# Patient Record
Sex: Female | Born: 1935
Health system: Southern US, Community
[De-identification: ages and names within clinical notes are randomized; demographics above are authoritative.]

## PROBLEM LIST (undated history)

## (undated) DIAGNOSIS — E7800 Pure hypercholesterolemia, unspecified: Secondary | ICD-10-CM

## (undated) DIAGNOSIS — I729 Aneurysm of unspecified site: Secondary | ICD-10-CM

## (undated) HISTORY — PX: CEREBRAL ANEURYSM REPAIR: SHX164

---

## 2013-03-02 ENCOUNTER — Emergency Department (HOSPITAL_COMMUNITY): Payer: Medicare HMO

## 2013-03-02 ENCOUNTER — Encounter (HOSPITAL_COMMUNITY): Payer: Self-pay | Admitting: Emergency Medicine

## 2013-03-02 ENCOUNTER — Emergency Department (HOSPITAL_COMMUNITY)
Admission: EM | Admit: 2013-03-02 | Discharge: 2013-03-02 | Disposition: A | Discharge status: Home / Self Care | Payer: Medicare HMO | Attending: Emergency Medicine | Admitting: Emergency Medicine

## 2013-03-02 DIAGNOSIS — S0003XA Contusion of scalp, initial encounter: Secondary | ICD-10-CM | POA: Insufficient documentation

## 2013-03-02 DIAGNOSIS — S60229A Contusion of unspecified hand, initial encounter: Secondary | ICD-10-CM | POA: Insufficient documentation

## 2013-03-02 DIAGNOSIS — Z79899 Other long term (current) drug therapy: Secondary | ICD-10-CM | POA: Insufficient documentation

## 2013-03-02 DIAGNOSIS — Y9301 Activity, walking, marching and hiking: Secondary | ICD-10-CM | POA: Insufficient documentation

## 2013-03-02 DIAGNOSIS — S5010XA Contusion of unspecified forearm, initial encounter: Secondary | ICD-10-CM | POA: Insufficient documentation

## 2013-03-02 DIAGNOSIS — S0083XA Contusion of other part of head, initial encounter: Secondary | ICD-10-CM

## 2013-03-02 DIAGNOSIS — T07XXXA Unspecified multiple injuries, initial encounter: Secondary | ICD-10-CM

## 2013-03-02 DIAGNOSIS — W19XXXA Unspecified fall, initial encounter: Secondary | ICD-10-CM

## 2013-03-02 DIAGNOSIS — Y92009 Unspecified place in unspecified non-institutional (private) residence as the place of occurrence of the external cause: Secondary | ICD-10-CM | POA: Insufficient documentation

## 2013-03-02 DIAGNOSIS — Z23 Encounter for immunization: Secondary | ICD-10-CM | POA: Insufficient documentation

## 2013-03-02 DIAGNOSIS — E7800 Pure hypercholesterolemia, unspecified: Secondary | ICD-10-CM | POA: Insufficient documentation

## 2013-03-02 DIAGNOSIS — S5000XA Contusion of unspecified elbow, initial encounter: Secondary | ICD-10-CM | POA: Insufficient documentation

## 2013-03-02 DIAGNOSIS — IMO0002 Reserved for concepts with insufficient information to code with codable children: Secondary | ICD-10-CM | POA: Insufficient documentation

## 2013-03-02 DIAGNOSIS — W010XXA Fall on same level from slipping, tripping and stumbling without subsequent striking against object, initial encounter: Secondary | ICD-10-CM | POA: Insufficient documentation

## 2013-03-02 HISTORY — DX: Pure hypercholesterolemia, unspecified: E78.00

## 2013-03-02 MED ORDER — BACITRACIN-NEOMYCIN-POLYMYXIN 400-5-5000 EX OINT
TOPICAL_OINTMENT | Freq: Once | CUTANEOUS | Status: AC
  Administered 2013-03-02: 4 via TOPICAL
  Filled 2013-03-02: qty 4

## 2013-03-02 MED ORDER — PHENYLEPHRINE HCL 0.5 % NA SOLN
1.0000 [drp] | Freq: Once | NASAL | Status: AC
  Administered 2013-03-02: 1 [drp] via NASAL
  Filled 2013-03-02: qty 15

## 2013-03-02 MED ORDER — HYDROCODONE-ACETAMINOPHEN 5-325 MG PO TABS: ORAL_TABLET | ORAL | Status: DC

## 2013-03-02 MED ORDER — TETANUS-DIPHTH-ACELL PERTUSSIS 5-2.5-18.5 LF-MCG/0.5 IM SUSP
0.5000 mL | Freq: Once | INTRAMUSCULAR | Status: AC
  Administered 2013-03-02: 0.5 mL via INTRAMUSCULAR
  Filled 2013-03-02: qty 0.5

## 2013-03-02 NOTE — ED Provider Notes (Signed)
CSN: [id number]     Arrival date & time 03/02/13  1534 History   First MD Initiated Contact with Patient 03/02/13 1605     Chief Complaint  Patient presents with  . Fall    HPI Pt was seen at 1605. Per pt and family, c/o sudden onset and resolution of one episode of slip and fall that occurred PTA. Pt states she was walking the dog down her driveway and "he just took off." States she tried to hold on to his leash and wound up falling down forward onto her bilat forearms, chest and face, sustaining multiple abrasions and bruises. Believes she had a nosebleed, now stopped. Pt was able to stand up and walk on her own after the incident.  Denies LOC, no syncope, no AMS, no neck or back pain, no palpitations, no SOB/cough, no abd pain, no N/V/D, no visual changes, no focal motor weakness, no tingling/numbness in extremities.    Unknown last Td Past Medical History  Diagnosis Date  . Hypercholesteremia    Past Surgical History  Procedure Laterality Date  . Cerebral aneurysm repair      History  Substance Use Topics  . Smoking status: Never Smoker   . Smokeless tobacco: Not on file  . Alcohol Use: No    Review of Systems ROS: Statement: All systems negative except as marked or noted in the HPI; Constitutional: Negative for fever and chills. ; ; Eyes: Negative for eye pain, redness and discharge. ; ; ENMT: +nosebleed. Negative for ear pain, hoarseness, nasal congestion, sinus pressure and sore throat. ; ; Cardiovascular: Negative for palpitations, diaphoresis, dyspnea and peripheral edema. ; ; Respiratory: Negative for cough, wheezing and stridor. ; ; Gastrointestinal: Negative for nausea, vomiting, diarrhea, abdominal pain, blood in stool, hematemesis, jaundice and rectal bleeding. . ; ; Genitourinary: Negative for dysuria, flank pain and hematuria. ; ; Musculoskeletal: +chest wall/ribs pain, face pain, forearms pain. Negative for back pain and neck pain. Negative for swelling and deformity..;  ; Skin: +abrasions, bruising. Negative for pruritus, rash,  blisters, and skin lesion.; ; Neuro: Negative for headache, lightheadedness and neck stiffness. Negative for weakness, altered level of consciousness , altered mental status, extremity weakness, paresthesias, involuntary movement, seizure and syncope.       Allergies  Niacin and related  Home Medications   Current Outpatient Rx  Name  Route  Sig  Dispense  Refill  . Ascorbic Acid (VITAMIN C PO)   Oral   Take 1 tablet by mouth daily.         Marland Kitchen aspirin EC 81 MG tablet   Oral   Take 81 mg by mouth at bedtime.         Marland Kitchen BIOTIN PO   Oral   Take 1 tablet by mouth daily.         Marland Kitchen CALCIUM PO   Oral   Take 1 tablet by mouth daily.         . Cholecalciferol (VITAMIN D PO)   Oral   Take 1 tablet by mouth daily.         . Coenzyme Q10 (CO Q 10 PO)   Oral   Take 1 tablet by mouth daily.         . fish oil-omega-3 fatty acids 1000 MG capsule      1 g 2 (two) times daily.         Marland Kitchen glucosamine-chondroitin 500-400 MG tablet   Oral   Take 1 tablet by mouth  daily.         . Nutritional Supplements (JOINT FORMULA PO)   Oral   Take 30 mLs by mouth daily.         . simvastatin (ZOCOR) 20 MG tablet   Oral   Take 20 mg by mouth at bedtime.          Marland Kitchen VITAMIN E PO   Oral   Take 1 tablet by mouth daily.          BP 139/54  Pulse 84  Temp(Src) 98 F (36.7 C) (Oral)  Ht 5\' 7"  (1.702 m)  Wt 199 lb (90.266 kg)  BMI 31.16 kg/m2  SpO2 95% Physical Exam 1610: Physical examination: Vital signs and O2 SAT: Reviewed; Constitutional: Well developed, Well nourished, Well hydrated, In no acute distress; Head and Face: Normocephalic, +left forehead contusion and small superficial abrasions to forehead and proximal nasal bridge. No lacs.  Non-tender to palp superior and inferior orbital rim areas.  No zygoma tenderness.  No mandibular tenderness.; Eyes: EOMI, PERRL, No scleral icterus. No conjunctival  injection. No obvious hyphema or hypopyon bilat.; ENMT: Mouth and pharynx normal, Left TM normal, Right TM normal, Mucous membranes moist, +teeth and tongue intact.  No active intraoral or intranasal bleeding. +dried blood right and left anterior nares. No blood in posterior pharynx. No septal hematomas.  No trismus, no malocclusion.; Neck: Supple, Trachea midline; Spine: No midline CS, TS, LS tenderness.; Cardiovascular: Regular rate and rhythm, No gallop; Respiratory: Breath sounds clear & equal bilaterally, No wheezes, Normal respiratory effort/excursion; Chest: Nontender, No deformity, Movement normal, No crepitus, No abrasions or ecchymosis.; Abdomen: Soft, Nontender, Nondistended, Normal bowel sounds, No abrasions or ecchymosis.; Genitourinary: No CVA tenderness;; Extremities: No deformity, Full range of motion major/large joints of bilat UE's and LE's without pain or tenderness to palp, Neurovascularly intact, Pulses normal, No tenderness, No edema, Pelvis stable. NT bilateral shoulders/elbows/wrists/hands/fingers. No bilat snuffbox tenderness.  No pain to bilat axial thumb or 3rd MCP loading.  Bilat forearm compartments soft, strong radial pp, brisk cap refill in fingers. Bilat hands NMS intact.; Neuro: AA&Ox3, GCS 15.  Major CN grossly intact. Speech clear. No gross focal motor or sensory deficits in extremities.; Skin: Color normal, Warm, Dry; +multiple scattered superficial abrasions and contusions to bilat hands, forearms, and elbows.     ED Course  Procedures   1615:  Wounds cleaned. Td updated. Neo-synephrine to nares. Right nasal mucosa friable; pt continues without active epistaxis bilat. Will continue to monitor.  1830:  No epistaxis while in the ED. VSS, resps easy, abd remains benign. Pt currently denies any pain and wants to go home now. Dx and testing d/w pt and family.  Questions answered.  Verb understanding, agreeable to d/c home with outpt f/u.    MDM  MDM Reviewed: nursing  note and vitals Interpretation: x-ray and CT scan      Dg Chest 2 View 03/02/2013   CLINICAL DATA:  Fall today. Bruising bilateral elbows, wrists and hands. Chest trauma.  EXAM: CHEST  2 VIEW  COMPARISON:  None.  FINDINGS: Cardiopericardial silhouette appears within normal limits. No pneumothorax is identified. Bilateral pleural apical scarring. Thoracic spine vertebral body height appears within normal limits. No pleural fluid. Mediastinal contours are within normal limits.  IMPRESSION: No active cardiopulmonary disease.   Electronically Signed   By: Andreas Newport M.D.   On: 03/02/2013 17:20   Dg Elbow Complete Left 03/02/2013   CLINICAL DATA:  Fall, elbow pain  EXAM: LEFT  ELBOW - COMPLETE 3+ VIEW  COMPARISON:  Concurrently obtained radiographs of the contralateral right elbow  FINDINGS: There is no evidence of fracture, dislocation, or joint effusion. There is no evidence of arthropathy or other focal bone abnormality. Soft tissues are unremarkable  IMPRESSION: Negative.   Electronically Signed   By: Malachy Moan M.D.   On: 03/02/2013 17:25   Dg Elbow Complete Right 03/02/2013   CLINICAL DATA:  Fall, elbow pain  EXAM: RIGHT ELBOW - COMPLETE 3+ VIEW  COMPARISON:  Concurrently obtained radiographs of the contralateral left elbow  FINDINGS: There is no evidence of fracture, dislocation, or joint effusion. Ossification adjacent to the medial epicondyles suggest prior medial epicondylitis. Soft tissues are unremarkable  IMPRESSION: No acute fracture or malalignment.   Electronically Signed   By: Malachy Moan M.D.   On: 03/02/2013 17:26   Dg Wrist Complete Left 03/02/2013   CLINICAL DATA:  Fall, bruising and pain  EXAM: LEFT WRIST - COMPLETE 3+ VIEW  COMPARISON:  Concurrently obtained radiographs of the bilateral wrists and hands  FINDINGS: No acute fracture or malalignment. Mild degenerative change at the thumb [facility] joint. Normal bony mineralization. No focal soft tissue abnormality.  IMPRESSION:  No acute osseous abnormality.   Electronically Signed   By: Malachy Moan M.D.   On: 03/02/2013 17:30   Dg Wrist Complete Right 03/02/2013   CLINICAL DATA:  Fall, wrist pain  EXAM: RIGHT WRIST - COMPLETE 3+ VIEW  COMPARISON:  Concurrently obtained radiographs of the bilateral wrists and hands  FINDINGS: No acute fracture or malalignment. Degenerative osteoarthritis noted in the thumb carpometacarpal joint. Bony mineralization is within normal limits for age. Mild soft tissue swelling over the dorsal aspect of the distal forearm.  IMPRESSION: No acute fracture or malalignment.  Degenerative osteoarthritis in the thumb carpometacarpal joint.   Electronically Signed   By: Malachy Moan M.D.   On: 03/02/2013 17:28   Ct Head Wo Contrast 03/02/2013   CLINICAL DATA:  Large left supraorbital hematoma following a fall today. Laceration at the bridge of the nose. Bruising beneath the left eye. History of aneurysm behind the right eye age, repaired in 1999. Headache and neck stiffness.  EXAM: CT HEAD WITHOUT CONTRAST  CT MAXILLOFACIAL WITHOUT CONTRAST  CT CERVICAL SPINE WITHOUT CONTRAST  TECHNIQUE: Multidetector CT imaging of the head, cervical spine, and maxillofacial structures were performed using the standard protocol without intravenous contrast. Multiplanar CT image reconstructions of the cervical spine and maxillofacial structures were also generated.  COMPARISON:  None.  FINDINGS: CT HEAD FINDINGS  Post craniotomy changes on the right. Right-sided suprasellar aneurysm clips. Left frontal scalp hematoma and soft tissue air. Bilateral hyperostosis frontalis. No skull fracture, intracranial hemorrhage or mass lesion. No paranasal sinus air-fluid levels. Normal size and position of the ventricles.  CT MAXILLOFACIAL FINDINGS  Left frontal scalp hematoma. Small amount of soft tissue air anterior to the nasal bone on the left. The nasal bone and anterior maxillary spine are intact without fracture. The remainder  of the facial bones are also intact. No paranasal sinus air-fluid levels. Left inferior maxillary sinus retention cyst and minimal mucosal thickening, within normal limits of physiological thickening.  CT CERVICAL SPINE FINDINGS  Multilevel degenerative changes. No prevertebral soft tissue swelling or fractures. Minimal anterolisthesis at the C3-4 level and at the C4-5 level with facet degenerative changes at those levels. Minimal carotid artery calcification on the right. Mildly heterogeneous thyroid gland with a 7 mm low-density nodule on the right on image  number 75. The thyroid gland is normal in size. Biapical pleural and parenchymal scarring.  IMPRESSION: CT HEAD IMPRESSION  1. Left frontal scalp hematoma and soft tissue air without skull fracture or intracranial hemorrhage. 2. Postsurgical changes, as described above.  CT MAXILLOFACIAL IMPRESSION  Mild left nasal soft tissue air without fracture.  CT CERVICAL SPINE IMPRESSION  1. No fracture or traumatic subluxation. 2. Multilevel degenerative changes. 3. Mildly heterogeneous thyroid gland with a 7 mm discrete nodule on the right. This is too small to characterize, but most likely benign in the absence of known clinical risk factors for thyroid carcinoma.   Electronically Signed   By: Gordan Payment M.D.   On: 03/02/2013 17:57   Ct Cervical Spine Wo Contrast 03/02/2013   CLINICAL DATA:  Large left supraorbital hematoma following a fall today. Laceration at the bridge of the nose. Bruising beneath the left eye. History of aneurysm behind the right eye age, repaired in 1999. Headache and neck stiffness.  EXAM: CT HEAD WITHOUT CONTRAST  CT MAXILLOFACIAL WITHOUT CONTRAST  CT CERVICAL SPINE WITHOUT CONTRAST  TECHNIQUE: Multidetector CT imaging of the head, cervical spine, and maxillofacial structures were performed using the standard protocol without intravenous contrast. Multiplanar CT image reconstructions of the cervical spine and maxillofacial structures were  also generated.  COMPARISON:  None.  FINDINGS: CT HEAD FINDINGS  Post craniotomy changes on the right. Right-sided suprasellar aneurysm clips. Left frontal scalp hematoma and soft tissue air. Bilateral hyperostosis frontalis. No skull fracture, intracranial hemorrhage or mass lesion. No paranasal sinus air-fluid levels. Normal size and position of the ventricles.  CT MAXILLOFACIAL FINDINGS  Left frontal scalp hematoma. Small amount of soft tissue air anterior to the nasal bone on the left. The nasal bone and anterior maxillary spine are intact without fracture. The remainder of the facial bones are also intact. No paranasal sinus air-fluid levels. Left inferior maxillary sinus retention cyst and minimal mucosal thickening, within normal limits of physiological thickening.  CT CERVICAL SPINE FINDINGS  Multilevel degenerative changes. No prevertebral soft tissue swelling or fractures. Minimal anterolisthesis at the C3-4 level and at the C4-5 level with facet degenerative changes at those levels. Minimal carotid artery calcification on the right. Mildly heterogeneous thyroid gland with a 7 mm low-density nodule on the right on image number 75. The thyroid gland is normal in size. Biapical pleural and parenchymal scarring.  IMPRESSION: CT HEAD IMPRESSION  1. Left frontal scalp hematoma and soft tissue air without skull fracture or intracranial hemorrhage. 2. Postsurgical changes, as described above.  CT MAXILLOFACIAL IMPRESSION  Mild left nasal soft tissue air without fracture.  CT CERVICAL SPINE IMPRESSION  1. No fracture or traumatic subluxation. 2. Multilevel degenerative changes. 3. Mildly heterogeneous thyroid gland with a 7 mm discrete nodule on the right. This is too small to characterize, but most likely benign in the absence of known clinical risk factors for thyroid carcinoma.   Electronically Signed   By: Gordan Payment M.D.   On: 03/02/2013 17:57   Dg Hand Complete Left 03/02/2013   CLINICAL DATA:  Fall, hand  pain  EXAM: LEFT HAND - COMPLETE 3+ VIEW  COMPARISON:  Concurrently obtained radiographs of the wrists  FINDINGS: There is no evidence of fracture or dislocation. Degenerative osteoarthritis of the distal interphalangeal joints with mild ulnar deviation at the DIP joints of the long finger. This bony mineralization is within normal limits for age. Soft tissues are unremarkable.  IMPRESSION: 1. No acute fracture or malalignment 2. Degenerative  osteoarthritis in the distal interphalangeal joints.   Electronically Signed   By: Malachy Moan M.D.   On: 03/02/2013 17:24   Dg Hand Complete Right 03/02/2013   CLINICAL DATA:  Fall, hand pain  EXAM: RIGHT HAND - COMPLETE 3+ VIEW  COMPARISON:  Concurrently obtained radiographs of the bilateral hands and wrists  FINDINGS: No acute fracture or malalignment. Degenerative osteoarthritis in the thumb carpometacarpal joint and index and little finger DIP joints. Bony mineralization is within normal limits  IMPRESSION: No acute fracture or malalignment.   Electronically Signed   By: Malachy Moan M.D.   On: 03/02/2013 17:29   Ct Maxillofacial Wo Cm  03/02/2013   CLINICAL DATA:  Large left supraorbital hematoma following a fall today. Laceration at the bridge of the nose. Bruising beneath the left eye. History of aneurysm behind the right eye age, repaired in 1999. Headache and neck stiffness.  EXAM: CT HEAD WITHOUT CONTRAST  CT MAXILLOFACIAL WITHOUT CONTRAST  CT CERVICAL SPINE WITHOUT CONTRAST  TECHNIQUE: Multidetector CT imaging of the head, cervical spine, and maxillofacial structures were performed using the standard protocol without intravenous contrast. Multiplanar CT image reconstructions of the cervical spine and maxillofacial structures were also generated.  COMPARISON:  None.  FINDINGS: CT HEAD FINDINGS  Post craniotomy changes on the right. Right-sided suprasellar aneurysm clips. Left frontal scalp hematoma and soft tissue air. Bilateral hyperostosis frontalis.  No skull fracture, intracranial hemorrhage or mass lesion. No paranasal sinus air-fluid levels. Normal size and position of the ventricles.  CT MAXILLOFACIAL FINDINGS  Left frontal scalp hematoma. Small amount of soft tissue air anterior to the nasal bone on the left. The nasal bone and anterior maxillary spine are intact without fracture. The remainder of the facial bones are also intact. No paranasal sinus air-fluid levels. Left inferior maxillary sinus retention cyst and minimal mucosal thickening, within normal limits of physiological thickening.  CT CERVICAL SPINE FINDINGS  Multilevel degenerative changes. No prevertebral soft tissue swelling or fractures. Minimal anterolisthesis at the C3-4 level and at the C4-5 level with facet degenerative changes at those levels. Minimal carotid artery calcification on the right. Mildly heterogeneous thyroid gland with a 7 mm low-density nodule on the right on image number 75. The thyroid gland is normal in size. Biapical pleural and parenchymal scarring.  IMPRESSION: CT HEAD IMPRESSION  1. Left frontal scalp hematoma and soft tissue air without skull fracture or intracranial hemorrhage. 2. Postsurgical changes, as described above.  CT MAXILLOFACIAL IMPRESSION  Mild left nasal soft tissue air without fracture.  CT CERVICAL SPINE IMPRESSION  1. No fracture or traumatic subluxation. 2. Multilevel degenerative changes. 3. Mildly heterogeneous thyroid gland with a 7 mm discrete nodule on the right. This is too small to characterize, but most likely benign in the absence of known clinical risk factors for thyroid carcinoma.   Electronically Signed   By: Gordan Payment M.D.   On: 03/02/2013 17:57       Laray Anger, DO 03/05/13 1109

## 2013-03-02 NOTE — ED Notes (Signed)
Fell on driveway , pulled  Down by dog.  No LOC,  Facial contusions, lt periorbital swelling and abrasion,  Abrasions and contusions to rt elbow and forearm.   Chest hurts.

## 2013-03-02 NOTE — ED Notes (Signed)
EDP back in to re eval 

## 2015-07-21 ENCOUNTER — Encounter (HOSPITAL_COMMUNITY): Payer: Self-pay | Admitting: *Deleted

## 2015-07-21 ENCOUNTER — Emergency Department (HOSPITAL_COMMUNITY): Payer: Medicare FFS

## 2015-07-21 ENCOUNTER — Observation Stay (HOSPITAL_COMMUNITY)
Admission: EM | Admit: 2015-07-21 | Discharge: 2015-07-22 | Disposition: A | Discharge status: Home / Self Care | Payer: Medicare FFS | Attending: Internal Medicine | Admitting: Internal Medicine

## 2015-07-21 DIAGNOSIS — I729 Aneurysm of unspecified site: Secondary | ICD-10-CM | POA: Diagnosis not present

## 2015-07-21 DIAGNOSIS — E7800 Pure hypercholesterolemia, unspecified: Secondary | ICD-10-CM | POA: Insufficient documentation

## 2015-07-21 DIAGNOSIS — G629 Polyneuropathy, unspecified: Secondary | ICD-10-CM | POA: Diagnosis not present

## 2015-07-21 DIAGNOSIS — R202 Paresthesia of skin: Principal | ICD-10-CM | POA: Insufficient documentation

## 2015-07-21 DIAGNOSIS — R200 Anesthesia of skin: Secondary | ICD-10-CM | POA: Diagnosis present

## 2015-07-21 DIAGNOSIS — Z7982 Long term (current) use of aspirin: Secondary | ICD-10-CM | POA: Diagnosis not present

## 2015-07-21 DIAGNOSIS — Z8673 Personal history of transient ischemic attack (TIA), and cerebral infarction without residual deficits: Secondary | ICD-10-CM | POA: Diagnosis present

## 2015-07-21 DIAGNOSIS — I671 Cerebral aneurysm, nonruptured: Secondary | ICD-10-CM | POA: Diagnosis present

## 2015-07-21 DIAGNOSIS — Z79899 Other long term (current) drug therapy: Secondary | ICD-10-CM | POA: Diagnosis not present

## 2015-07-21 DIAGNOSIS — G459 Transient cerebral ischemic attack, unspecified: Secondary | ICD-10-CM | POA: Diagnosis present

## 2015-07-21 HISTORY — DX: Aneurysm of unspecified site: I72.9

## 2015-07-21 LAB — DIFFERENTIAL
BASOS ABS: 0 10*3/uL (ref 0.0–0.1)
Basophils Relative: 1 %
EOS ABS: 0.1 10*3/uL (ref 0.0–0.7)
Eosinophils Relative: 2 %
LYMPHS ABS: 1.7 10*3/uL (ref 0.7–4.0)
Lymphocytes Relative: 25 %
MONO ABS: 0.6 10*3/uL (ref 0.1–1.0)
Monocytes Relative: 9 %
NEUTROS PCT: 63 %
Neutro Abs: 4.4 10*3/uL (ref 1.7–7.7)

## 2015-07-21 LAB — RAPID URINE DRUG SCREEN, HOSP PERFORMED
Amphetamines: NOT DETECTED
BARBITURATES: NOT DETECTED
Benzodiazepines: NOT DETECTED
Cocaine: NOT DETECTED
Opiates: NOT DETECTED
Tetrahydrocannabinol: NOT DETECTED

## 2015-07-21 LAB — PROTIME-INR
INR: 1.03 (ref 0.00–1.49)
PROTHROMBIN TIME: 13.7 s (ref 11.6–15.2)

## 2015-07-21 LAB — URINALYSIS, ROUTINE W REFLEX MICROSCOPIC
Bilirubin Urine: NEGATIVE
Glucose, UA: NEGATIVE mg/dL
Hgb urine dipstick: NEGATIVE
Ketones, ur: NEGATIVE mg/dL
LEUKOCYTES UA: NEGATIVE
Nitrite: NEGATIVE
PROTEIN: NEGATIVE mg/dL
Specific Gravity, Urine: <1.005 — ABNORMAL LOW (ref 1.005–1.030)
pH: 6 (ref 5.0–8.0)

## 2015-07-21 LAB — COMPREHENSIVE METABOLIC PANEL
ALT: 21 U/L (ref 14–54)
AST: 23 U/L (ref 15–41)
Albumin: 4.6 g/dL (ref 3.5–5.0)
Alkaline Phosphatase: 44 U/L (ref 38–126)
Anion gap: 8 (ref 5–15)
BILIRUBIN TOTAL: 0.5 mg/dL (ref 0.3–1.2)
BUN: 13 mg/dL (ref 6–20)
CO2: 29 mmol/L (ref 22–32)
CREATININE: 0.96 mg/dL (ref 0.44–1.00)
Calcium: 10.1 mg/dL (ref 8.9–10.3)
Chloride: 107 mmol/L (ref 101–111)
GFR, EST NON AFRICAN AMERICAN: 56 mL/min — AB (ref >60)
Glucose, Bld: 104 mg/dL — ABNORMAL HIGH (ref 65–99)
POTASSIUM: 4.7 mmol/L (ref 3.5–5.1)
Sodium: 144 mmol/L (ref 135–145)
TOTAL PROTEIN: 7.5 g/dL (ref 6.5–8.1)

## 2015-07-21 LAB — CBC
HCT: 41.7 % (ref 36.0–46.0)
Hemoglobin: 13.9 g/dL (ref 12.0–15.0)
MCH: 30.5 pg (ref 26.0–34.0)
MCHC: 33.3 g/dL (ref 30.0–36.0)
MCV: 91.6 fL (ref 78.0–100.0)
PLATELETS: 196 10*3/uL (ref 150–400)
RBC: 4.55 MIL/uL (ref 3.87–5.11)
RDW: 12.3 % (ref 11.5–15.5)
WBC: 6.8 10*3/uL (ref 4.0–10.5)

## 2015-07-21 LAB — I-STAT CHEM 8, ED
BUN: 12 mg/dL (ref 6–20)
CREATININE: 0.9 mg/dL (ref 0.44–1.00)
Calcium, Ion: 1.29 mmol/L (ref 1.13–1.30)
Chloride: 103 mmol/L (ref 101–111)
Glucose, Bld: 98 mg/dL (ref 65–99)
HEMATOCRIT: 44 % (ref 36.0–46.0)
HEMOGLOBIN: 15 g/dL (ref 12.0–15.0)
POTASSIUM: 4.6 mmol/L (ref 3.5–5.1)
SODIUM: 144 mmol/L (ref 135–145)
TCO2: 28 mmol/L (ref 0–100)

## 2015-07-21 LAB — CBG MONITORING, ED: GLUCOSE-CAPILLARY: 98 mg/dL (ref 65–99)

## 2015-07-21 LAB — APTT: APTT: 28 s (ref 24–37)

## 2015-07-21 LAB — I-STAT TROPONIN, ED: Troponin i, poc: 0 ng/mL (ref 0.00–0.08)

## 2015-07-21 LAB — ETHANOL: Alcohol, Ethyl (B): 5 mg/dL — ABNORMAL HIGH (ref <5)

## 2015-07-21 MED ORDER — OMEGA-3 FATTY ACIDS 1000 MG PO CAPS: 1.0000 g | ORAL_CAPSULE | Freq: Every day | ORAL | Status: DC

## 2015-07-21 MED ORDER — ENOXAPARIN SODIUM 40 MG/0.4ML ~~LOC~~ SOLN
40.0000 mg | SUBCUTANEOUS | Status: DC
  Administered 2015-07-21: 40 mg via SUBCUTANEOUS
  Filled 2015-07-21: qty 0.4

## 2015-07-21 MED ORDER — SIMVASTATIN 20 MG PO TABS
20.0000 mg | ORAL_TABLET | Freq: Every day | ORAL | Status: DC
  Administered 2015-07-21: 20 mg via ORAL
  Filled 2015-07-21: qty 1

## 2015-07-21 MED ORDER — ASPIRIN EC 81 MG PO TBEC
81.0000 mg | DELAYED_RELEASE_TABLET | Freq: Every day | ORAL | Status: DC
  Administered 2015-07-21: 81 mg via ORAL
  Filled 2015-07-21: qty 1

## 2015-07-21 MED ORDER — CO Q 10 10 MG PO CAPS: ORAL_CAPSULE | Freq: Every day | ORAL | Status: DC

## 2015-07-21 MED ORDER — OMEGA-3-ACID ETHYL ESTERS 1 G PO CAPS
1.0000 g | ORAL_CAPSULE | Freq: Two times a day (BID) | ORAL | Status: DC
  Administered 2015-07-22: 1 g via ORAL
  Filled 2015-07-21 (×2): qty 1

## 2015-07-21 MED ORDER — IOHEXOL 350 MG/ML SOLN
75.0000 mL | Freq: Once | INTRAVENOUS | Status: AC | PRN
  Administered 2015-07-21: 75 mL via INTRAVENOUS

## 2015-07-21 MED ORDER — SODIUM CHLORIDE 0.9% FLUSH
3.0000 mL | Freq: Two times a day (BID) | INTRAVENOUS | Status: DC
  Administered 2015-07-21 – 2015-07-22 (×2): 3 mL via INTRAVENOUS

## 2015-07-21 MED ORDER — ACETAMINOPHEN 500 MG PO TABS: 1000.0000 mg | ORAL_TABLET | Freq: Four times a day (QID) | ORAL | Status: DC | PRN

## 2015-07-21 NOTE — ED Notes (Signed)
Pt went to bed last night at 2300. Woke up this morning "sometime before daylight" and noticed numbness to her tongue (present when she woke up). States she also noticed tenderness/tingling to left side of scalp which has been intermittent since Thursday. Pt went to work at [employer] and her boss told her her mouth was drooping and sent her home. Mild asymetry noted during triage.

## 2015-07-21 NOTE — ED Notes (Signed)
Pt ambulated to BR without difficulty

## 2015-07-21 NOTE — H&P (Signed)
[facility] History and Physical  Teresa Simon [id number] DOB: [birth year]    PCP:   No primary care provider on file.   Chief Complaint:  Numbness of the tip of the tongue, paresthesia and pain on left side of the head, and reported facial droop.   HPI: Teresa Simon is an 80 y.o. female with hx of cerebral aneurysm in the 90's, s/p surgery with clip and craniotomy, hx of HLD, but never prior CVA, presented to the ER with intermittent pain on her left scalp, numbness of the tip of her tongue, and reportedly having left facial droop but not seen in the ER.  She has no symptoms below her face.  No vertigo or slurred speech.  Evaluation in the ER included a head CT which showed no acute process, anterior cerebral clip, and CTA of the head and carotid showed no critical stenosis.   Rewiew of Systems:  Constitutional: Negative for malaise, fever and chills. No significant weight loss or weight gain Eyes: Negative for eye pain, redness and discharge, diplopia, visual changes, or flashes of light. ENMT: Negative for ear pain, hoarseness, nasal congestion, sinus pressure and sore throat. No headaches; tinnitus, drooling, or problem swallowing. Cardiovascular: Negative for chest pain, palpitations, diaphoresis, dyspnea and peripheral edema. ; No orthopnea, PND Respiratory: Negative for cough, hemoptysis, wheezing and stridor. No pleuritic chestpain. Gastrointestinal: Negative for nausea, vomiting, diarrhea, constipation, abdominal pain, melena, blood in stool, hematemesis, jaundice and rectal bleeding.    Genitourinary: Negative for frequency, dysuria, incontinence,flank pain and hematuria; Musculoskeletal: Negative for back pain and neck pain. Negative for swelling and trauma.;  Skin: . Negative for pruritus, rash, abrasions, bruising and skin lesion.; ulcerations Neuro: Negative for headache, lightheadedness and neck stiffness. Negative for weakness, altered level of consciousness ,  altered mental status, extremity weakness, burning feet, involuntary movement, seizure and syncope.  Psych: negative for anxiety, depression, insomnia, tearfulness, panic attacks, hallucinations, paranoia, suicidal or homicidal ideation    Past Medical History  Diagnosis Date  . Hypercholesteremia   . Aneurysm [facility])     Past Surgical History  Procedure Laterality Date  . Cerebral aneurysm repair      Medications:  HOME MEDS: Prior to Admission medications   Medication Sig Start Date End Date Taking? Authorizing Provider  acetaminophen (TYLENOL) 500 MG tablet Take 1,000 mg by mouth every 6 (six) hours as needed for mild pain.   Yes Historical Provider, MD  Ascorbic Acid (VITAMIN C PO) Take 1 tablet by mouth daily.   Yes Historical Provider, MD  aspirin EC 81 MG tablet Take 81 mg by mouth at bedtime.   Yes Historical Provider, MD  BIOTIN PO Take 1 tablet by mouth daily.   Yes Historical Provider, MD  CALCIUM PO Take 1 tablet by mouth daily.   Yes Historical Provider, MD  Cholecalciferol (VITAMIN D PO) Take 1 tablet by mouth daily.   Yes Historical Provider, MD  Coenzyme Q10 (CO Q 10 PO) Take 1 tablet by mouth daily.   Yes Historical Provider, MD  fish oil-omega-3 fatty acids 1000 MG capsule 1 g 2 (two) times daily.   Yes Historical Provider, MD  glucosamine-chondroitin 500-400 MG tablet Take 1 tablet by mouth daily.   Yes Historical Provider, MD  Nutritional Supplements (JOINT FORMULA PO) Take 30 mLs by mouth daily.   Yes Historical Provider, MD  simvastatin (ZOCOR) 20 MG tablet Take 20 mg by mouth at bedtime.  02/22/13  Yes Historical Provider, MD  Sodium Chloride-Xylitol (Walker  SPRAY NA) Place 2 sprays into the nose 2 (two) times daily.   Yes Historical Provider, MD  VITAMIN E PO Take 1 tablet by mouth daily.   Yes Historical Provider, MD  HYDROcodone-acetaminophen (NORCO/VICODIN) 5-325 MG per tablet 1 tab PO q12hours prn pain 03/02/13   Francine Graven, DO      Allergies:  Allergies  Allergen Reactions  . Niacin And Related Other (See Comments)    Redness, burning sensation    Social History:   reports that she has never smoked. She does not have any smokeless tobacco history on file. She reports that she does not drink alcohol or use illicit drugs.  Family History: History reviewed. No pertinent family history.   Physical Exam: Filed Vitals:   07/21/15 1438 07/21/15 1500 07/21/15 1530 07/21/15 1700  BP:  130/70 147/46 136/56  Pulse:  62 78   Temp: 98.7 F (37.1 C)     TempSrc:      Resp:  21 26 15   Height:      Weight:      SpO2:  94% 92%    Blood pressure 136/56, pulse 78, temperature 98.7 F (37.1 C), temperature source Oral, resp. rate 15, height 5\' 7"  (1.702 m), weight 77.111 kg (170 lb), SpO2 92 %.  GEN:  Pleasant  patient lying in the stretcher in no acute distress; cooperative with exam. PSYCH:  alert and oriented x4; does not appear anxious or depressed; affect is appropriate. HEENT: Mucous membranes pink and anicteric; PERRLA; EOM intact; no cervical lymphadenopathy nor thyromegaly or carotid bruit; no JVD; There were no stridor. Neck is very supple. Breasts:: Not examined CHEST WALL: No tenderness CHEST: Normal respiration, clear to auscultation bilaterally.  HEART: Regular rate and rhythm.  There are no murmur, rub, or gallops.   BACK: No kyphosis or scoliosis; no CVA tenderness ABDOMEN: soft and non-tender; no masses, no organomegaly, normal abdominal bowel sounds; no pannus; no intertriginous candida. There is no rebound and no distention. Rectal Exam: Not done EXTREMITIES: No bone or joint deformity; age-appropriate arthropathy of the hands and knees; no edema; no ulcerations.  There is no calf tenderness. Genitalia: not examined PULSES: 2+ and symmetric SKIN: Normal hydration no rash or ulceration CNS: Cranial nerves 2-12 grossly intact no focal lateralizing neurologic deficit.  Speech is fluent; uvula  elevated with phonation, facial symmetry and tongue midline. DTR are normal bilaterally, cerebella exam is intact, barbinski is negative and strengths are equaled bilaterally.  No sensory loss.   Labs on Admission:  Basic Metabolic Panel:  Recent Labs Lab 07/21/15 1329 07/21/15 1348  NA 144 144  K 4.7 4.6  CL 107 103  CO2 29  --   GLUCOSE 104* 98  BUN 13 12  CREATININE 0.96 0.90  CALCIUM 10.1  --    Liver Function Tests:  Recent Labs Lab 07/21/15 1329  AST 23  ALT 21  ALKPHOS 44  BILITOT 0.5  PROT 7.5  ALBUMIN 4.6   CBC:  Recent Labs Lab 07/21/15 1329 07/21/15 1348  WBC 6.8  --   NEUTROABS 4.4  --   HGB 13.9 15.0  HCT 41.7 44.0  MCV 91.6  --   PLT 196  --    Cardiac Enzymes: No results for input(s): CKTOTAL, CKMB, CKMBINDEX, TROPONINI in the last 168 hours.  CBG:  Recent Labs Lab 07/21/15 1318  GLUCAP 98     Radiological Exams on Admission: Ct Angio Head W/cm &/or Wo Cm  07/21/2015  CLINICAL DATA:  Patient complains of tongue numbness, now resolved. No reported neurologic deficit. Possible RIGHT facial droop, now resolved. History of aneurysm clipping in 1999. Tingling to LEFT scalp. EXAM: CT ANGIOGRAPHY HEAD AND NECK TECHNIQUE: Multidetector CT imaging of the head and neck was performed using the standard protocol during bolus administration of intravenous contrast. Multiplanar CT image reconstructions and MIPs were obtained to evaluate the vascular anatomy. Carotid stenosis measurements (when applicable) are obtained utilizing NASCET criteria, using the distal internal carotid diameter as the denominator. CONTRAST:  10mL OMNIPAQUE IOHEXOL 350 MG/ML SOLN COMPARISON:  Noncontrast CT head earlier today. FINDINGS: CT HEAD Calvarium and skull base: No fracture or destructive lesion. Mastoids and middle ears are grossly clear. RIGHT subtemporal craniectomy and pterional craniotomy is uncomplicated. Paranasal sinuses: Imaged portions are clear. Scalp soft tissues  unremarkable. Orbits: Negative. Brain: No evidence of acute abnormality, including acute infarct, hemorrhage, hydrocephalus, or mass lesion. RIGHT aneurysm clip is unremarkable. CTA NECK Aortic arch: Standard branching. Imaged portion shows no evidence of aneurysm or dissection. No significant stenosis of the major arch vessel origins. Right carotid system: No evidence of dissection, stenosis (50% or greater) or occlusion. Left carotid system: No evidence of dissection, stenosis (50% or greater) or occlusion. Vertebral arteries: LEFT slightly larger. Minor atheromatous change proximally. No evidence of dissection, stenosis (50% or greater) or occlusion. Nonvascular soft tissues: Apical pleural thickening. Spondylosis. No neck masses. Unremarkable salivary glands. Unremarkable thyroid. Airway midline. Multiple missing teeth. CTA HEAD Anterior circulation: RIGHT ICA terminus or posterior communicating artery aneurysm clip in good position. No aneurysm neck is observed. No proximal stenosis or occlusion of significance. No aneurysm, or vascular malformation. Posterior circulation: No significant stenosis, proximal occlusion, aneurysm, or vascular malformation. Venous sinuses: As permitted by contrast timing, patent. Anatomic variants: None of significance. Delayed phase:   No abnormal intracranial enhancement. IMPRESSION: Status post RIGHT internal carotid artery aneurysm clipping. No evidence for new or residual aneurysm. No acute intracranial findings. No extracranial or intracranial stenosis of significance. Scalp soft tissues unremarkable. Electronically Signed   By: Staci Righter M.D.   On: 07/21/2015 16:16   Ct Head Wo Contrast  07/21/2015  CLINICAL DATA:  80 year old who underwent surgical repair of a right middle cerebral artery aneurysm approximately 18 years ago, presenting with possible TIA. Patient awoke last night with numbness to the tongue which has subsequently resolved. Patient went to work today  and was noticed by [employer] workers to have a mild right facial droop, though this has also subsequently resolved. EXAM: CT HEAD WITHOUT CONTRAST TECHNIQUE: Contiguous axial images were obtained from the base of the skull through the vertex without intravenous contrast. COMPARISON:  03/02/2013. FINDINGS: Prior right frontotemporal craniotomy for clipping of a right middle cerebral artery aneurysm. Ventricular system normal in size and appearance for age. No significant atrophy for age. No mass lesion. No midline shift. No acute hemorrhage or hematoma. No extra-axial fluid collections. No evidence of acute infarction. No significant interval change. Hyperostosis frontalis interna. Visualized paranasal sinuses, bilateral mastoid air cells and bilateral middle ear cavities well-aerated. Minimal bilateral carotid siphon atherosclerosis. IMPRESSION: 1. No acute intracranial abnormality. 2. Prior right frontotemporal craniotomy for clipping of right middle cerebral artery aneurysm without complicating features. Electronically Signed   By: Evangeline Dakin M.D.   On: 07/21/2015 14:11   Ct Angio Neck W/cm &/or Wo/cm  07/21/2015  CLINICAL DATA:  Patient complains of tongue numbness, now resolved. No reported neurologic deficit. Possible RIGHT facial droop, now resolved. History of aneurysm clipping in  1999. Tingling to LEFT scalp. EXAM: CT ANGIOGRAPHY HEAD AND NECK TECHNIQUE: Multidetector CT imaging of the head and neck was performed using the standard protocol during bolus administration of intravenous contrast. Multiplanar CT image reconstructions and MIPs were obtained to evaluate the vascular anatomy. Carotid stenosis measurements (when applicable) are obtained utilizing NASCET criteria, using the distal internal carotid diameter as the denominator. CONTRAST:  27mL OMNIPAQUE IOHEXOL 350 MG/ML SOLN COMPARISON:  Noncontrast CT head earlier today. FINDINGS: CT HEAD Calvarium and skull base: No fracture or destructive  lesion. Mastoids and middle ears are grossly clear. RIGHT subtemporal craniectomy and pterional craniotomy is uncomplicated. Paranasal sinuses: Imaged portions are clear. Scalp soft tissues unremarkable. Orbits: Negative. Brain: No evidence of acute abnormality, including acute infarct, hemorrhage, hydrocephalus, or mass lesion. RIGHT aneurysm clip is unremarkable. CTA NECK Aortic arch: Standard branching. Imaged portion shows no evidence of aneurysm or dissection. No significant stenosis of the major arch vessel origins. Right carotid system: No evidence of dissection, stenosis (50% or greater) or occlusion. Left carotid system: No evidence of dissection, stenosis (50% or greater) or occlusion. Vertebral arteries: LEFT slightly larger. Minor atheromatous change proximally. No evidence of dissection, stenosis (50% or greater) or occlusion. Nonvascular soft tissues: Apical pleural thickening. Spondylosis. No neck masses. Unremarkable salivary glands. Unremarkable thyroid. Airway midline. Multiple missing teeth. CTA HEAD Anterior circulation: RIGHT ICA terminus or posterior communicating artery aneurysm clip in good position. No aneurysm neck is observed. No proximal stenosis or occlusion of significance. No aneurysm, or vascular malformation. Posterior circulation: No significant stenosis, proximal occlusion, aneurysm, or vascular malformation. Venous sinuses: As permitted by contrast timing, patent. Anatomic variants: None of significance. Delayed phase:   No abnormal intracranial enhancement. IMPRESSION: Status post RIGHT internal carotid artery aneurysm clipping. No evidence for new or residual aneurysm. No acute intracranial findings. No extracranial or intracranial stenosis of significance. Scalp soft tissues unremarkable. Electronically Signed   By: Staci Righter M.D.   On: 07/21/2015 16:16    EKG: Independently reviewed.   Assessment/Plan Present on Admission:  . TIA (transient ischemic attack) .  Aneurysm of anterior cerebral artery  PLAN:  This patient has numbness of the tip her tongue, along with pain and paresthesia of the left scalp, and I suspect she has neuropathy (The Chorda Tympani supplies taste to the anterior 2/3rd of the tongue).  I don't think this is a presentation of a TIA.  Focal seizure can present like this, but I just don't think so. She already had her CTA of the head and neck, and so carotid doppler is not needed.  She is admitted at the recommendation of Dr Sampson Goon after speaking with EDP, so will admit her OBS, and finish the stroke work up with ECHO.  She has cerebral aneurysm in the 1990's, so it is likely ferrous, and exclude the use of MRI.  Will continue with her ASA, and continue with her home meds.  Thank you and Good Day.   Other plans as per orders.  Code Status: FULL Haskel Khan, MD. FACP [facility] Pager [phone] 7pm to 7am.  07/21/2015, 5:22 PM

## 2015-07-21 NOTE — ED Provider Notes (Signed)
CSN: [id number]     Arrival date & time 07/21/15  1226 History  By signing my name below, I, Jolayne Panther, attest that this documentation has been prepared under the direction and in the presence of Ezequiel Essex, MD. Electronically Signed: Jolayne Panther, Scribe. 07/21/2015. 1:06 PM.   Chief Complaint  Patient presents with  . Numbness   The history is provided by the patient and a relative.   HPI Comments: Teresa Simon is a 80 y.o. female with a hx of a cerebral aneurism about 18 years ago which was followed by two months of HA's, who presents to the Emergency Department complaining of sudden onset of numbness to her tongue last night while sleeping which has since resolved. Pt also complains of onset of a draw to the left side of her face and right sided facial droop which was noticed earlier this morning by her coworkers while at work. Pt also reports associated intermittent burning, shooting, tingling pain to her left parietal lobe which has been ongoing for the past three days. She denies HA, visual disturbance, chest pain, abdominal pain, trouble swallowing, difficulty ambulating and dizziness.    Past Medical History  Diagnosis Date  . Hypercholesteremia   . Aneurysm [facility])    Past Surgical History  Procedure Laterality Date  . Cerebral aneurysm repair     History reviewed. No pertinent family history. Social History  Substance Use Topics  . Smoking status: Never Smoker   . Smokeless tobacco: None  . Alcohol Use: No   OB History    No data available     Review of Systems A complete 10 system review of systems was obtained and all systems are negative except as noted in the HPI and PMH.   Allergies  Niacin and related  Home Medications   Prior to Admission medications   Medication Sig Start Date End Date Taking? Authorizing Provider  acetaminophen (TYLENOL) 500 MG tablet Take 1,000 mg by mouth every 6 (six) hours as needed for mild pain.   Yes  Historical Provider, MD  Ascorbic Acid (VITAMIN C PO) Take 1 tablet by mouth daily.   Yes Historical Provider, MD  aspirin EC 81 MG tablet Take 81 mg by mouth at bedtime.   Yes Historical Provider, MD  BIOTIN PO Take 1 tablet by mouth daily.   Yes Historical Provider, MD  CALCIUM PO Take 1 tablet by mouth daily.   Yes Historical Provider, MD  Cholecalciferol (VITAMIN D PO) Take 1 tablet by mouth daily.   Yes Historical Provider, MD  Coenzyme Q10 (CO Q 10 PO) Take 1 tablet by mouth daily.   Yes Historical Provider, MD  fish oil-omega-3 fatty acids 1000 MG capsule 1 g 2 (two) times daily.   Yes Historical Provider, MD  glucosamine-chondroitin 500-400 MG tablet Take 1 tablet by mouth daily.   Yes Historical Provider, MD  Nutritional Supplements (JOINT FORMULA PO) Take 30 mLs by mouth daily.   Yes Historical Provider, MD  simvastatin (ZOCOR) 20 MG tablet Take 20 mg by mouth at bedtime.  02/22/13  Yes Historical Provider, MD  Sodium Chloride-Xylitol (XLEAR SINUS CARE SPRAY NA) Place 2 sprays into the nose 2 (two) times daily.   Yes Historical Provider, MD  VITAMIN E PO Take 1 tablet by mouth daily.   Yes Historical Provider, MD  HYDROcodone-acetaminophen (NORCO/VICODIN) 5-325 MG per tablet 1 tab PO q12hours prn pain 03/02/13   Francine Graven, DO   BP 136/56 mmHg  Pulse 78  Temp(Src) 98.7 F (37.1 C) (Oral)  Resp 15  Ht 5\' 7"  (1.702 m)  Wt 170 lb (77.111 kg)  BMI 26.62 kg/m2  SpO2 92% Physical Exam  Constitutional: She is oriented to person, place, and time. She appears well-developed and well-nourished. No distress.  HENT:  Head: Normocephalic and atraumatic.  Mouth/Throat: Oropharynx is clear and moist. No oropharyngeal exudate.  Eyes: Conjunctivae and EOM are normal. Pupils are equal, round, and reactive to light.  Neck: Normal range of motion. Neck supple.  No meningismus.  Cardiovascular: Normal rate, regular rhythm, normal heart sounds and intact distal pulses.   No murmur  heard. Pulmonary/Chest: Effort normal and breath sounds normal. No respiratory distress.  Abdominal: Soft. There is no tenderness. There is no rebound and no guarding.  Musculoskeletal: Normal range of motion. She exhibits no edema or tenderness.  Neurological: She is alert and oriented to person, place, and time. No cranial nerve deficit. She exhibits normal muscle tone. Coordination normal.  No ataxia on finger to nose bilaterally. No pronator drift. 5/5 strength throughout. CN 2-12 intact.Equal grip strength. Sensation intact.  Subjective paresthesias left scalp No nystagmus  Negative romberg Normal gate Possible slight right nasolabial fold flattening but smile is symmetric  Skin: Skin is warm.  Psychiatric: She has a normal mood and affect. Her behavior is normal.  Nursing note and vitals reviewed.   ED Course  Procedures  DIAGNOSTIC STUDIES:    Oxygen Saturation is 96% on RA, adequate by my interpretation.   COORDINATION OF CARE:  12:56 PM Will order labs and a CT of pt's head without contrast. Discussed treatment plan with pt at bedside and pt agreed to plan.   Labs Review Labs Reviewed  ETHANOL - Abnormal; Notable for the following:    Alcohol, Ethyl (B) 5 (*)    All other components within normal limits  COMPREHENSIVE METABOLIC PANEL - Abnormal; Notable for the following:    Glucose, Bld 104 (*)    GFR calc non Af Amer 56 (*)    All other components within normal limits  URINALYSIS, ROUTINE W REFLEX MICROSCOPIC (NOT AT [facility]) - Abnormal; Notable for the following:    Specific Gravity, Urine <1.005 (*)    All other components within normal limits  PROTIME-INR  APTT  CBC  DIFFERENTIAL  URINE RAPID DRUG SCREEN, HOSP PERFORMED  I-STAT CHEM 8, ED  I-STAT TROPOININ, ED  CBG MONITORING, ED   Imaging Review Ct Angio Head W/cm &/or Wo Cm  07/21/2015  CLINICAL DATA:  Patient complains of tongue numbness, now resolved. No reported neurologic deficit. Possible RIGHT  facial droop, now resolved. History of aneurysm clipping in 1999. Tingling to LEFT scalp. EXAM: CT ANGIOGRAPHY HEAD AND NECK TECHNIQUE: Multidetector CT imaging of the head and neck was performed using the standard protocol during bolus administration of intravenous contrast. Multiplanar CT image reconstructions and MIPs were obtained to evaluate the vascular anatomy. Carotid stenosis measurements (when applicable) are obtained utilizing NASCET criteria, using the distal internal carotid diameter as the denominator. CONTRAST:  21mL OMNIPAQUE IOHEXOL 350 MG/ML SOLN COMPARISON:  Noncontrast CT head earlier today. FINDINGS: CT HEAD Calvarium and skull base: No fracture or destructive lesion. Mastoids and middle ears are grossly clear. RIGHT subtemporal craniectomy and pterional craniotomy is uncomplicated. Paranasal sinuses: Imaged portions are clear. Scalp soft tissues unremarkable. Orbits: Negative. Brain: No evidence of acute abnormality, including acute infarct, hemorrhage, hydrocephalus, or mass lesion. RIGHT aneurysm clip is unremarkable. CTA NECK Aortic arch: Standard branching. Imaged portion  shows no evidence of aneurysm or dissection. No significant stenosis of the major arch vessel origins. Right carotid system: No evidence of dissection, stenosis (50% or greater) or occlusion. Left carotid system: No evidence of dissection, stenosis (50% or greater) or occlusion. Vertebral arteries: LEFT slightly larger. Minor atheromatous change proximally. No evidence of dissection, stenosis (50% or greater) or occlusion. Nonvascular soft tissues: Apical pleural thickening. Spondylosis. No neck masses. Unremarkable salivary glands. Unremarkable thyroid. Airway midline. Multiple missing teeth. CTA HEAD Anterior circulation: RIGHT ICA terminus or posterior communicating artery aneurysm clip in good position. No aneurysm neck is observed. No proximal stenosis or occlusion of significance. No aneurysm, or vascular  malformation. Posterior circulation: No significant stenosis, proximal occlusion, aneurysm, or vascular malformation. Venous sinuses: As permitted by contrast timing, patent. Anatomic variants: None of significance. Delayed phase:   No abnormal intracranial enhancement. IMPRESSION: Status post RIGHT internal carotid artery aneurysm clipping. No evidence for new or residual aneurysm. No acute intracranial findings. No extracranial or intracranial stenosis of significance. Scalp soft tissues unremarkable. Electronically Signed   By: Staci Righter M.D.   On: 07/21/2015 16:16   Ct Head Wo Contrast  07/21/2015  CLINICAL DATA:  80 year old who underwent surgical repair of a right middle cerebral artery aneurysm approximately 18 years ago, presenting with possible TIA. Patient awoke last night with numbness to the tongue which has subsequently resolved. 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 EXAM: CT HEAD WITHOUT CONTRAST TECHNIQUE: Contiguous axial images were obtained from the base of the skull through the vertex without intravenous contrast. COMPARISON:  03/02/2013. FINDINGS: Prior right frontotemporal craniotomy for clipping of a right middle cerebral artery aneurysm. Ventricular system normal in size and appearance for age. No significant atrophy for age. No mass lesion. No midline shift. No acute hemorrhage or hematoma. No extra-axial fluid collections. No evidence of acute infarction. No significant interval change. Hyperostosis frontalis interna. Visualized paranasal sinuses, bilateral mastoid air cells and bilateral middle ear cavities well-aerated. Minimal bilateral carotid siphon atherosclerosis. IMPRESSION: 1. No acute intracranial abnormality. 2. Prior right frontotemporal craniotomy for clipping of right middle cerebral artery aneurysm without complicating features. Electronically Signed   By: Evangeline Dakin M.D.   On: 07/21/2015 14:11   Ct Angio Neck W/cm &/or Wo/cm  07/21/2015  CLINICAL DATA:  Patient complains of tongue numbness, now resolved. No reported neurologic deficit. Possible RIGHT facial droop, now resolved. History of aneurysm clipping in 1999. Tingling to LEFT scalp. EXAM: CT ANGIOGRAPHY HEAD AND NECK TECHNIQUE: Multidetector CT imaging of the head and neck was performed using the standard protocol during bolus administration of intravenous contrast. Multiplanar CT image reconstructions and MIPs were obtained to evaluate the vascular anatomy. Carotid stenosis measurements (when applicable) are obtained utilizing NASCET criteria, using the distal internal carotid diameter as the denominator. CONTRAST:  92mL OMNIPAQUE IOHEXOL 350 MG/ML SOLN COMPARISON:  Noncontrast CT head earlier today. FINDINGS: CT HEAD Calvarium and skull base: No fracture or destructive lesion. Mastoids and middle ears are grossly clear. RIGHT subtemporal craniectomy and pterional craniotomy is uncomplicated. Paranasal sinuses: Imaged portions are clear. Scalp soft tissues unremarkable. Orbits: Negative. Brain: No evidence of acute abnormality, including acute infarct, hemorrhage, hydrocephalus, or mass lesion. RIGHT aneurysm clip is unremarkable. CTA NECK Aortic arch: Standard branching. Imaged portion shows no evidence of aneurysm or dissection. No significant stenosis of the major arch vessel origins. Right carotid system: No evidence of  dissection, stenosis (50% or greater) or occlusion. Left carotid system: No evidence of dissection, stenosis (50% or greater) or occlusion. Vertebral arteries: LEFT slightly larger. Minor atheromatous change proximally. No evidence of dissection, stenosis (50% or greater) or occlusion. Nonvascular soft tissues: Apical pleural thickening. Spondylosis. No neck masses. Unremarkable salivary glands. Unremarkable thyroid. Airway midline. Multiple missing teeth. CTA HEAD Anterior circulation:  RIGHT ICA terminus or posterior communicating artery aneurysm clip in good position. No aneurysm neck is observed. No proximal stenosis or occlusion of significance. No aneurysm, or vascular malformation. Posterior circulation: No significant stenosis, proximal occlusion, aneurysm, or vascular malformation. Venous sinuses: As permitted by contrast timing, patent. Anatomic variants: None of significance. Delayed phase:   No abnormal intracranial enhancement. IMPRESSION: Status post RIGHT internal carotid artery aneurysm clipping. No evidence for new or residual aneurysm. No acute intracranial findings. No extracranial or intracranial stenosis of significance. Scalp soft tissues unremarkable. Electronically Signed   By: Staci Righter M.D.   On: 07/21/2015 16:16   I have personally reviewed and evaluated these images and lab results as part of my medical decision-making.   EKG Interpretation  Date/Time:  Sunday July 21 2015 13:15:32 EST Ventricular Rate:  68 PR Interval:  146 QRS Duration: 135 QT Interval:  441 QTC Calculation: 469 R Axis:   121 Text Interpretation:  Sinus rhythm Nonspecific intraventricular conduction delay Anterolateral infarct, old Baseline wander in lead(s) V1 No previous ECGs available Confirmed by Tellis Spivak  MD, [place] [phone]) on 07/21/2015 1:26:45 PM        MDM   Final diagnoses:  Paresthesias   Patient with paresthesias to left scalp for the past 4 days. This morning coworkers noticed that she had a facial droop on the right. Last seen normal last night. Overnight she also had some episodes of tingling of her tongue which has since resolved. History of aneurysm repair in 1999.   code stroke not activated due to delay in presentation.   CT head shows no acute pathology and stable aneurysm clips. We'll check CTA.  CTA is stable.     patient reports she has no further tongue numbness. She has persistent paresthesias to the left side of her scalp. There is no  facial asymmetry noted.  D/w Dr. Merlene Laughter.  He recommends admission for stroke workup. Patient unlikely able to get MRI due to her aneurysm clips.  Observation admission d/w Dr. Marin Comment.    I personally performed the services described in this documentation, which was scribed in my presence. The recorded information has been reviewed and is accurate.    Ezequiel Essex, MD 07/21/15 [id number]

## 2015-07-22 ENCOUNTER — Observation Stay (HOSPITAL_BASED_OUTPATIENT_CLINIC_OR_DEPARTMENT_OTHER): Payer: Medicare FFS

## 2015-07-22 DIAGNOSIS — I671 Cerebral aneurysm, nonruptured: Secondary | ICD-10-CM | POA: Diagnosis not present

## 2015-07-22 DIAGNOSIS — G629 Polyneuropathy, unspecified: Secondary | ICD-10-CM | POA: Diagnosis not present

## 2015-07-22 DIAGNOSIS — G459 Transient cerebral ischemic attack, unspecified: Secondary | ICD-10-CM | POA: Diagnosis not present

## 2015-07-22 DIAGNOSIS — R202 Paresthesia of skin: Secondary | ICD-10-CM

## 2015-07-22 LAB — BASIC METABOLIC PANEL
ANION GAP: 7 (ref 5–15)
BUN: 16 mg/dL (ref 6–20)
CALCIUM: 9.1 mg/dL (ref 8.9–10.3)
CO2: 29 mmol/L (ref 22–32)
Chloride: 108 mmol/L (ref 101–111)
Creatinine, Ser: 1.03 mg/dL — ABNORMAL HIGH (ref 0.44–1.00)
GFR calc Af Amer: 59 mL/min — ABNORMAL LOW (ref >60)
GFR, EST NON AFRICAN AMERICAN: 51 mL/min — AB (ref >60)
GLUCOSE: 114 mg/dL — AB (ref 65–99)
Potassium: 4.3 mmol/L (ref 3.5–5.1)
SODIUM: 144 mmol/L (ref 135–145)

## 2015-07-22 LAB — CBC
HCT: 40.7 % (ref 36.0–46.0)
HEMOGLOBIN: 13.7 g/dL (ref 12.0–15.0)
MCH: 30.8 pg (ref 26.0–34.0)
MCHC: 33.7 g/dL (ref 30.0–36.0)
MCV: 91.5 fL (ref 78.0–100.0)
Platelets: 186 10*3/uL (ref 150–400)
RBC: 4.45 MIL/uL (ref 3.87–5.11)
RDW: 12.2 % (ref 11.5–15.5)
WBC: 6.4 10*3/uL (ref 4.0–10.5)

## 2015-07-22 LAB — TSH: TSH: 1.977 u[IU]/mL (ref 0.350–4.500)

## 2015-07-22 LAB — VITAMIN B12: VITAMIN B 12: 426 pg/mL (ref 180–914)

## 2015-07-22 MED ORDER — ASPIRIN EC 325 MG PO TBEC: 325.0000 mg | DELAYED_RELEASE_TABLET | Freq: Every day | ORAL | Status: DC

## 2015-07-22 NOTE — Care Management Obs Status (Signed)
[place] NOTIFICATION   Patient Details  Name: Teresa Simon MRN: [id number] Date of Birth: Feb 23, 1936   Medicare Observation Status Notification Given:  Yes    Alvie Heidelberg, RN 07/22/2015, 8:21 AM

## 2015-07-22 NOTE — Discharge Summary (Signed)
Physician Discharge Summary  Teresa Simon MRN: [id number] DOB/AGE: [birth year] 80 y.o.  PCP: No primary care provider on file.   Admit date: 07/21/2015 Discharge date: 07/22/2015  Discharge Diagnoses:     Principal Problem:   TIA (transient ischemic attack) Active Problems:   Neuropathy ([place])   Aneurysm of anterior cerebral artery    Follow-up recommendations Follow-up with PCP in 3-5 days , including all  additional recommended appointments as below Follow-up CBC, CMP in 3-5 days Patient to follow-up with neurology in 1-2 weeks PCP to follow-up on the results of the 2-D echo     Medication List    TAKE these medications        acetaminophen 500 MG tablet  Commonly known as:  TYLENOL  Take 1,000 mg by mouth every 6 (six) hours as needed for mild pain.     aspirin EC 325 MG tablet  Take 1 tablet (325 mg total) by mouth daily.     BIOTIN PO  Take 1 tablet by mouth daily.     CALCIUM PO  Take 1 tablet by mouth daily.     CO Q 10 PO  Take 1 tablet by mouth daily.     fish oil-omega-3 fatty acids 1000 MG capsule  1 g 2 (two) times daily.     glucosamine-chondroitin 500-400 MG tablet  Take 1 tablet by mouth daily.     HYDROcodone-acetaminophen 5-325 MG tablet  Commonly known as:  NORCO/VICODIN  1 tab PO q12hours prn pain     JOINT FORMULA PO  Take 30 mLs by mouth daily.     simvastatin 20 MG tablet  Commonly known as:  ZOCOR  Take 20 mg by mouth at bedtime.     VITAMIN C PO  Take 1 tablet by mouth daily.     VITAMIN D PO  Take 1 tablet by mouth daily.     VITAMIN E PO  Take 1 tablet by mouth daily.     XLEAR SINUS CARE SPRAY NA  Place 2 sprays into the nose 2 (two) times daily.         Discharge Condition:  Stable   Discharge Instructions       Discharge Instructions    Diet - low sodium heart healthy    Complete by:  As directed      Increase activity slowly    Complete by:  As directed            Allergies  Allergen  Reactions  . Niacin And Related Other (See Comments)    Redness, burning sensation      Disposition: 01-Home or Self Care   Consults:  Telephone consultation with neurology  Significant Diagnostic Studies:  Ct Angio Head W/cm &/or Wo Cm  07/21/2015  CLINICAL DATA:  Patient complains of tongue numbness, now resolved. No reported neurologic deficit. Possible RIGHT facial droop, now resolved. History of aneurysm clipping in 1999. Tingling to LEFT scalp. EXAM: CT ANGIOGRAPHY HEAD AND NECK TECHNIQUE: Multidetector CT imaging of the head and neck was performed using the standard protocol during bolus administration of intravenous contrast. Multiplanar CT image reconstructions and MIPs were obtained to evaluate the vascular anatomy. Carotid stenosis measurements (when applicable) are obtained utilizing NASCET criteria, using the distal internal carotid diameter as the denominator. CONTRAST:  46m OMNIPAQUE IOHEXOL 350 MG/ML SOLN COMPARISON:  Noncontrast CT head earlier today. FINDINGS: CT HEAD Calvarium and skull base: No fracture or destructive lesion. Mastoids and middle ears are grossly clear. RIGHT  subtemporal craniectomy and pterional craniotomy is uncomplicated. Paranasal sinuses: Imaged portions are clear. Scalp soft tissues unremarkable. Orbits: Negative. Brain: No evidence of acute abnormality, including acute infarct, hemorrhage, hydrocephalus, or mass lesion. RIGHT aneurysm clip is unremarkable. CTA NECK Aortic arch: Standard branching. Imaged portion shows no evidence of aneurysm or dissection. No significant stenosis of the major arch vessel origins. Right carotid system: No evidence of dissection, stenosis (50% or greater) or occlusion. Left carotid system: No evidence of dissection, stenosis (50% or greater) or occlusion. Vertebral arteries: LEFT slightly larger. Minor atheromatous change proximally. No evidence of dissection, stenosis (50% or greater) or occlusion. Nonvascular soft tissues:  Apical pleural thickening. Spondylosis. No neck masses. Unremarkable salivary glands. Unremarkable thyroid. Airway midline. Multiple missing teeth. CTA HEAD Anterior circulation: RIGHT ICA terminus or posterior communicating artery aneurysm clip in good position. No aneurysm neck is observed. No proximal stenosis or occlusion of significance. No aneurysm, or vascular malformation. Posterior circulation: No significant stenosis, proximal occlusion, aneurysm, or vascular malformation. Venous sinuses: As permitted by contrast timing, patent. Anatomic variants: None of significance. Delayed phase:   No abnormal intracranial enhancement. IMPRESSION: Status post RIGHT internal carotid artery aneurysm clipping. No evidence for new or residual aneurysm. No acute intracranial findings. No extracranial or intracranial stenosis of significance. Scalp soft tissues unremarkable. Electronically Signed   By: Staci Righter M.D.   On: 07/21/2015 16:16   Ct Head Wo Contrast  07/21/2015  CLINICAL DATA:  80 year old who underwent surgical repair of a right middle cerebral artery aneurysm approximately 18 years ago, presenting with possible TIA. Patient awoke last night with numbness to the tongue which has subsequently resolved. 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 EXAM: CT HEAD WITHOUT CONTRAST TECHNIQUE: Contiguous axial images were obtained from the base of the skull through the vertex without intravenous contrast. COMPARISON:  03/02/2013. FINDINGS: Prior right frontotemporal craniotomy for clipping of a right middle cerebral artery aneurysm. Ventricular system normal in size and appearance for age. No significant atrophy for age. No mass lesion. No midline shift. No acute hemorrhage or hematoma. No extra-axial fluid collections. No evidence of acute infarction. No significant interval change. Hyperostosis frontalis interna.  Visualized paranasal sinuses, bilateral mastoid air cells and bilateral middle ear cavities well-aerated. Minimal bilateral carotid siphon atherosclerosis. IMPRESSION: 1. No acute intracranial abnormality. 2. Prior right frontotemporal craniotomy for clipping of right middle cerebral artery aneurysm without complicating features. Electronically Signed   By: Evangeline Dakin M.D.   On: 07/21/2015 14:11   Ct Angio Neck W/cm &/or Wo/cm  07/21/2015  CLINICAL DATA:  Patient complains of tongue numbness, now resolved. No reported neurologic deficit. Possible RIGHT facial droop, now resolved. History of aneurysm clipping in 1999. Tingling to LEFT scalp. EXAM: CT ANGIOGRAPHY HEAD AND NECK TECHNIQUE: Multidetector CT imaging of the head and neck was performed using the standard protocol during bolus administration of intravenous contrast. Multiplanar CT image reconstructions and MIPs were obtained to evaluate the vascular anatomy. Carotid stenosis measurements (when applicable) are obtained utilizing NASCET criteria, using the distal internal carotid diameter as the denominator. CONTRAST:  52m OMNIPAQUE IOHEXOL 350 MG/ML SOLN COMPARISON:  Noncontrast CT head earlier today. FINDINGS: CT HEAD Calvarium and skull base: No fracture or destructive lesion. Mastoids and middle ears are grossly clear. RIGHT subtemporal craniectomy and pterional craniotomy is uncomplicated. Paranasal sinuses: Imaged portions are clear. Scalp soft tissues unremarkable. Orbits: Negative. Brain: No evidence  of acute abnormality, including acute infarct, hemorrhage, hydrocephalus, or mass lesion. RIGHT aneurysm clip is unremarkable. CTA NECK Aortic arch: Standard branching. Imaged portion shows no evidence of aneurysm or dissection. No significant stenosis of the major arch vessel origins. Right carotid system: No evidence of dissection, stenosis (50% or greater) or occlusion. Left carotid system: No evidence of dissection, stenosis (50% or greater)  or occlusion. Vertebral arteries: LEFT slightly larger. Minor atheromatous change proximally. No evidence of dissection, stenosis (50% or greater) or occlusion. Nonvascular soft tissues: Apical pleural thickening. Spondylosis. No neck masses. Unremarkable salivary glands. Unremarkable thyroid. Airway midline. Multiple missing teeth. CTA HEAD Anterior circulation: RIGHT ICA terminus or posterior communicating artery aneurysm clip in good position. No aneurysm neck is observed. No proximal stenosis or occlusion of significance. No aneurysm, or vascular malformation. Posterior circulation: No significant stenosis, proximal occlusion, aneurysm, or vascular malformation. Venous sinuses: As permitted by contrast timing, patent. Anatomic variants: None of significance. Delayed phase:   No abnormal intracranial enhancement. IMPRESSION: Status post RIGHT internal carotid artery aneurysm clipping. No evidence for new or residual aneurysm. No acute intracranial findings. No extracranial or intracranial stenosis of significance. Scalp soft tissues unremarkable. Electronically Signed   By: Staci Righter M.D.   On: 07/21/2015 16:16       Filed Weights   07/21/15 1235 07/21/15 1840  Weight: 77.111 kg (170 lb) 78.2 kg (172 lb 6.4 oz)     Microbiology: No results found for this or any previous visit (from the past 240 hour(s)).     Blood Culture No results found for: SDES, [place], CULT, REPTSTATUS    Labs: Results for orders placed or performed during the hospital encounter of 07/21/15 (from the past 48 hour(s))  CBG monitoring, ED     Status: None   Collection Time: 07/21/15  1:18 PM  Result Value Ref Range   Glucose-Capillary 98 65 - 99 mg/dL  Ethanol     Status: Abnormal   Collection Time: 07/21/15  1:29 PM  Result Value Ref Range   Alcohol, Ethyl (B) 5 (H) <5 mg/dL    Comment:        LOWEST DETECTABLE LIMIT FOR SERUM ALCOHOL IS 5 mg/dL FOR MEDICAL PURPOSES ONLY   Protime-INR     Status:  None   Collection Time: 07/21/15  1:29 PM  Result Value Ref Range   Prothrombin Time 13.7 11.6 - 15.2 seconds   INR 1.03 0.00 - 1.49  APTT     Status: None   Collection Time: 07/21/15  1:29 PM  Result Value Ref Range   aPTT 28 24 - 37 seconds  CBC     Status: None   Collection Time: 07/21/15  1:29 PM  Result Value Ref Range   WBC 6.8 4.0 - 10.5 K/uL   RBC 4.55 3.87 - 5.11 MIL/uL   Hemoglobin 13.9 12.0 - 15.0 g/dL   HCT 41.7 36.0 - 46.0 %   MCV 91.6 78.0 - 100.0 fL   MCH 30.5 26.0 - 34.0 pg   MCHC 33.3 30.0 - 36.0 g/dL   RDW 12.3 11.5 - 15.5 %   Platelets 196 150 - 400 K/uL  Differential     Status: None   Collection Time: 07/21/15  1:29 PM  Result Value Ref Range   Neutrophils Relative % 63 %   Neutro Abs 4.4 1.7 - 7.7 K/uL   Lymphocytes Relative 25 %   Lymphs Abs 1.7 0.7 - 4.0 K/uL   Monocytes Relative 9 %  Monocytes Absolute 0.6 0.1 - 1.0 K/uL   Eosinophils Relative 2 %   Eosinophils Absolute 0.1 0.0 - 0.7 K/uL   Basophils Relative 1 %   Basophils Absolute 0.0 0.0 - 0.1 K/uL  Comprehensive metabolic panel     Status: Abnormal   Collection Time: 07/21/15  1:29 PM  Result Value Ref Range   Sodium 144 135 - 145 mmol/L   Potassium 4.7 3.5 - 5.1 mmol/L   Chloride 107 101 - 111 mmol/L   CO2 29 22 - 32 mmol/L   Glucose, Bld 104 (H) 65 - 99 mg/dL   BUN 13 6 - 20 mg/dL   Creatinine, Ser 0.96 0.44 - 1.00 mg/dL   Calcium 10.1 8.9 - 10.3 mg/dL   Total Protein 7.5 6.5 - 8.1 g/dL   Albumin 4.6 3.5 - 5.0 g/dL   AST 23 15 - 41 U/L   ALT 21 14 - 54 U/L   Alkaline Phosphatase 44 38 - 126 U/L   Total Bilirubin 0.5 0.3 - 1.2 mg/dL   GFR calc non Af Amer 56 (L) >60 mL/min   GFR calc Af Amer >60 >60 mL/min    Comment: (NOTE) The eGFR has been calculated using the CKD EPI equation. This calculation has not been validated in all clinical situations. eGFR's persistently <60 mL/min signify possible Chronic Kidney Disease.    Anion gap 8 5 - 15  I-stat troponin, ED (not at [facility],  [facility])     Status: None   Collection Time: 07/21/15  1:46 PM  Result Value Ref Range   Troponin i, poc 0.00 0.00 - 0.08 ng/mL   Comment 3            Comment: Due to the release kinetics of cTnI, a negative result within the first hours of the onset of symptoms does not rule out myocardial infarction with certainty. If myocardial infarction is still suspected, repeat the test at appropriate intervals.   I-Stat Chem 8, ED  (not at [facility], [facility])     Status: None   Collection Time: 07/21/15  1:48 PM  Result Value Ref Range   Sodium 144 135 - 145 mmol/L   Potassium 4.6 3.5 - 5.1 mmol/L   Chloride 103 101 - 111 mmol/L   BUN 12 6 - 20 mg/dL   Creatinine, Ser 0.90 0.44 - 1.00 mg/dL   Glucose, Bld 98 65 - 99 mg/dL   Calcium, Ion 1.29 1.13 - 1.30 mmol/L   TCO2 28 0 - 100 mmol/L   Hemoglobin 15.0 12.0 - 15.0 g/dL   HCT 44.0 36.0 - 46.0 %  Urine rapid drug screen (hosp performed)not at [facility]     Status: None   Collection Time: 07/21/15  2:42 PM  Result Value Ref Range   Opiates NONE DETECTED NONE DETECTED   Cocaine NONE DETECTED NONE DETECTED   Benzodiazepines NONE DETECTED NONE DETECTED   Amphetamines NONE DETECTED NONE DETECTED   Tetrahydrocannabinol NONE DETECTED NONE DETECTED   Barbiturates NONE DETECTED NONE DETECTED    Comment:        DRUG SCREEN FOR MEDICAL PURPOSES ONLY.  IF CONFIRMATION IS NEEDED FOR ANY PURPOSE, NOTIFY LAB WITHIN 5 DAYS.        LOWEST DETECTABLE LIMITS FOR URINE DRUG SCREEN Drug Class       Cutoff (ng/mL) Amphetamine      1000 Barbiturate      200 Benzodiazepine   628 Tricyclics       315 Opiates  300 Cocaine          300 THC              50   Urinalysis, Routine w reflex microscopic (not at [facility])     Status: Abnormal   Collection Time: 07/21/15  2:42 PM  Result Value Ref Range   Color, Urine YELLOW YELLOW   APPearance CLEAR CLEAR   Specific Gravity, Urine <1.005 (L) 1.005 - 1.030   pH 6.0 5.0 - 8.0   Glucose, UA NEGATIVE NEGATIVE mg/dL    Hgb urine dipstick NEGATIVE NEGATIVE   Bilirubin Urine NEGATIVE NEGATIVE   Ketones, ur NEGATIVE NEGATIVE mg/dL   Protein, ur NEGATIVE NEGATIVE mg/dL   Nitrite NEGATIVE NEGATIVE   Leukocytes, UA NEGATIVE NEGATIVE    Comment: MICROSCOPIC NOT DONE ON URINES WITH NEGATIVE PROTEIN, BLOOD, LEUKOCYTES, NITRITE, OR GLUCOSE <1000 mg/dL.  TSH     Status: None   Collection Time: 07/21/15 11:11 PM  Result Value Ref Range   TSH 1.977 0.350 - 4.500 uIU/mL  Basic metabolic panel     Status: Abnormal   Collection Time: 07/22/15  6:25 AM  Result Value Ref Range   Sodium 144 135 - 145 mmol/L   Potassium 4.3 3.5 - 5.1 mmol/L   Chloride 108 101 - 111 mmol/L   CO2 29 22 - 32 mmol/L   Glucose, Bld 114 (H) 65 - 99 mg/dL   BUN 16 6 - 20 mg/dL   Creatinine, Ser 1.03 (H) 0.44 - 1.00 mg/dL   Calcium 9.1 8.9 - 10.3 mg/dL   GFR calc non Af Amer 51 (L) >60 mL/min   GFR calc Af Amer 59 (L) >60 mL/min    Comment: (NOTE) The eGFR has been calculated using the CKD EPI equation. This calculation has not been validated in all clinical situations. eGFR's persistently <60 mL/min signify possible Chronic Kidney Disease.    Anion gap 7 5 - 15  CBC     Status: None   Collection Time: 07/22/15  6:25 AM  Result Value Ref Range   WBC 6.4 4.0 - 10.5 K/uL   RBC 4.45 3.87 - 5.11 MIL/uL   Hemoglobin 13.7 12.0 - 15.0 g/dL   HCT 40.7 36.0 - 46.0 %   MCV 91.5 78.0 - 100.0 fL   MCH 30.8 26.0 - 34.0 pg   MCHC 33.7 30.0 - 36.0 g/dL   RDW 12.2 11.5 - 15.5 %   Platelets 186 150 - 400 K/uL     Lipid Panel  No results found for: CHOL, TRIG, HDL, CHOLHDL, VLDL, LDLCALC, LDLDIRECT   No results found for: HGBA1C   Lab Results  Component Value Date   CREATININE 1.03* 07/22/2015     HPI : 80 y.o. female with hx of cerebral aneurysm in the 90's, s/p surgery with clip and craniotomy, hx of HLD, but never prior CVA, presented to the ER with intermittent pain on her left scalp, numbness of the tip of her tongue, and  reportedly having left facial droop but not seen in the ER. She has no symptoms below her face. No vertigo or slurred speech. Evaluation in the ER included a head CT which showed no acute process, anterior cerebral clip, and CTA of the head and carotid showed no critical stenosis.   HOSPITAL COURSE:  Left facial droop and numbness of the tongue Admitted for TIA Unable to have MRI secondary to right internal carotid artery aneurysm clipping CT scan negative, CT angiogram of the head and  neck negative Discussed with neurology Dr. Georgeanna Harrison via telephone He recommended that the patient be discharged home on 325 mg of aspirin and follow-up with neurology 2-D echo pending at this time, patient can be discharged home after this is completed Patient will be taking all her medical records back to her neurologist in [facility]  Discharge Exam:    Blood pressure 122/43, pulse 78, temperature 97.9 F (36.6 C), temperature source Oral, resp. rate 21, height 5' 7"  (1.702 m), weight 78.2 kg (172 lb 6.4 oz), SpO2 97 %.   Pulmonary/Chest: Effort normal and breath sounds normal. No respiratory distress.  Abdominal: Soft. There is no tenderness. There is no rebound and no guarding.  Musculoskeletal: Normal range of motion. She exhibits no edema or tenderness.  Neurological: She is alert and oriented to person, place, and time. No cranial nerve deficit. She exhibits normal muscle tone. Coordination normal.  No ataxia on finger to nose bilaterally. No pronator drift. 5/5 strength throughout. CN 2-12 intact.Equal grip strength. Sensation intact.  Subjective paresthesias left scalp No nystagmus  Negative romberg   Follow-up Information    Follow up with [facility], KOFI, MD. Schedule an appointment as soon as possible for a visit in 3 days.   Specialty:  Neurology   Contact information:   [address] [place] [address] [phone]       Signed: Reyne Dumas 07/22/2015, 10:58 AM         Time spent >45 mins

## 2015-07-22 NOTE — Progress Notes (Signed)
Pt IV and telemetry removed, tolerated well.  Reviewed discharge instructions with pt and answered all questions at this time. 

## 2015-07-22 NOTE — Care Management Note (Signed)
Case Management Note  Patient Details  Name: Dazaria Rosenbauer MRN: [id number] Date of Birth: May 08, 1936  Subjective/Objective:                  Spoke with patient for discharge planning patient is from home with husband , drives self and is still working.  No CM needs identified.  Action/Plan: Home with self care.  Expected Discharge Date:  07/22/15               Expected Discharge Plan:  Home/Self Care  In-House Referral:     Discharge planning Services  CM Consult  Post Acute Care Choice:    Choice offered to:     DME Arranged:    DME Agency:     HH Arranged:    HH Agency:     Status of Service:  Completed, signed off  Medicare Important Message Given:    Date Medicare IM Given:    Medicare IM give by:    Date Additional Medicare IM Given:    Additional Medicare Important Message give by:     If discussed at [place] of Stay Meetings, dates discussed:    Additional Comments:  Alvie Heidelberg, RN 07/22/2015, 8:27 AM

## 2015-07-22 NOTE — Progress Notes (Signed)
*  PRELIMINARY RESULTS* Echocardiogram 2D Echocardiogram has been performed.  Leavy Cella 07/22/2015, 3:27 PM

## 2019-12-28 DIAGNOSIS — D23111 Other benign neoplasm of skin of right upper eyelid, including canthus: Secondary | ICD-10-CM | POA: Diagnosis not present

## 2019-12-28 DIAGNOSIS — Z872 Personal history of diseases of the skin and subcutaneous tissue: Secondary | ICD-10-CM | POA: Diagnosis not present

## 2019-12-28 DIAGNOSIS — D230 Other benign neoplasm of skin of lip: Secondary | ICD-10-CM | POA: Diagnosis not present

## 2019-12-28 DIAGNOSIS — Z85828 Personal history of other malignant neoplasm of skin: Secondary | ICD-10-CM | POA: Diagnosis not present

## 2019-12-28 DIAGNOSIS — D23121 Other benign neoplasm of skin of left upper eyelid, including canthus: Secondary | ICD-10-CM | POA: Diagnosis not present

## 2019-12-28 DIAGNOSIS — Z1283 Encounter for screening for malignant neoplasm of skin: Secondary | ICD-10-CM | POA: Diagnosis not present

## 2019-12-28 DIAGNOSIS — D2339 Other benign neoplasm of skin of other parts of face: Secondary | ICD-10-CM | POA: Diagnosis not present

## 2019-12-28 DIAGNOSIS — D234 Other benign neoplasm of skin of scalp and neck: Secondary | ICD-10-CM | POA: Diagnosis not present

## 2019-12-28 DIAGNOSIS — Z719 Counseling, unspecified: Secondary | ICD-10-CM | POA: Diagnosis not present

## 2020-03-16 ENCOUNTER — Emergency Department (HOSPITAL_COMMUNITY): Payer: Medicare PPO

## 2020-03-16 ENCOUNTER — Other Ambulatory Visit: Payer: Self-pay

## 2020-03-16 ENCOUNTER — Emergency Department (HOSPITAL_COMMUNITY)
Admission: EM | Admit: 2020-03-16 | Discharge: 2020-03-16 | Disposition: A | Discharge status: Home / Self Care | Payer: Medicare PPO | Attending: Emergency Medicine | Admitting: Emergency Medicine

## 2020-03-16 ENCOUNTER — Encounter (HOSPITAL_COMMUNITY): Payer: Self-pay

## 2020-03-16 DIAGNOSIS — M25512 Pain in left shoulder: Secondary | ICD-10-CM | POA: Insufficient documentation

## 2020-03-16 DIAGNOSIS — M542 Cervicalgia: Secondary | ICD-10-CM | POA: Diagnosis not present

## 2020-03-16 DIAGNOSIS — M79602 Pain in left arm: Secondary | ICD-10-CM | POA: Insufficient documentation

## 2020-03-16 DIAGNOSIS — Z7982 Long term (current) use of aspirin: Secondary | ICD-10-CM | POA: Insufficient documentation

## 2020-03-16 DIAGNOSIS — R519 Headache, unspecified: Secondary | ICD-10-CM | POA: Insufficient documentation

## 2020-03-16 DIAGNOSIS — R9431 Abnormal electrocardiogram [ECG] [EKG]: Secondary | ICD-10-CM | POA: Diagnosis not present

## 2020-03-16 DIAGNOSIS — Z79899 Other long term (current) drug therapy: Secondary | ICD-10-CM | POA: Insufficient documentation

## 2020-03-16 DIAGNOSIS — Z20822 Contact with and (suspected) exposure to covid-19: Secondary | ICD-10-CM | POA: Insufficient documentation

## 2020-03-16 DIAGNOSIS — Z8673 Personal history of transient ischemic attack (TIA), and cerebral infarction without residual deficits: Secondary | ICD-10-CM | POA: Diagnosis not present

## 2020-03-16 DIAGNOSIS — Z8679 Personal history of other diseases of the circulatory system: Secondary | ICD-10-CM | POA: Diagnosis not present

## 2020-03-16 DIAGNOSIS — I6523 Occlusion and stenosis of bilateral carotid arteries: Secondary | ICD-10-CM | POA: Diagnosis not present

## 2020-03-16 LAB — RESPIRATORY PANEL BY RT PCR (FLU A&B, COVID)
Influenza A by PCR: NEGATIVE
Influenza B by PCR: NEGATIVE
SARS Coronavirus 2 by RT PCR: NEGATIVE

## 2020-03-16 LAB — BASIC METABOLIC PANEL
Anion gap: 9 (ref 5–15)
BUN: 15 mg/dL (ref 8–23)
CO2: 27 mmol/L (ref 22–32)
Calcium: 9.3 mg/dL (ref 8.9–10.3)
Chloride: 102 mmol/L (ref 98–111)
Creatinine, Ser: 0.84 mg/dL (ref 0.44–1.00)
GFR, Estimated: >60 mL/min (ref >60)
Glucose, Bld: 120 mg/dL — ABNORMAL HIGH (ref 70–99)
Potassium: 4.2 mmol/L (ref 3.5–5.1)
Sodium: 138 mmol/L (ref 135–145)

## 2020-03-16 LAB — CBC
HCT: 40.6 % (ref 36.0–46.0)
Hemoglobin: 13.8 g/dL (ref 12.0–15.0)
MCH: 31 pg (ref 26.0–34.0)
MCHC: 34 g/dL (ref 30.0–36.0)
MCV: 91.2 fL (ref 80.0–100.0)
Platelets: 228 10*3/uL (ref 150–400)
RBC: 4.45 MIL/uL (ref 3.87–5.11)
RDW: 12.3 % (ref 11.5–15.5)
WBC: 9.6 10*3/uL (ref 4.0–10.5)
nRBC: 0 % (ref 0.0–0.2)

## 2020-03-16 LAB — PROTIME-INR
INR: 1 (ref 0.8–1.2)
Prothrombin Time: 13 seconds (ref 11.4–15.2)

## 2020-03-16 MED ORDER — METOCLOPRAMIDE HCL 10 MG PO TABS: 10.0000 mg | ORAL_TABLET | Freq: Three times a day (TID) | ORAL | 0 refills | Status: DC | PRN

## 2020-03-16 MED ORDER — IOHEXOL 350 MG/ML SOLN
75.0000 mL | Freq: Once | INTRAVENOUS | Status: AC | PRN
  Administered 2020-03-16: 75 mL via INTRAVENOUS

## 2020-03-16 MED ORDER — METOCLOPRAMIDE HCL 5 MG/ML IJ SOLN
10.0000 mg | Freq: Once | INTRAMUSCULAR | Status: AC
  Administered 2020-03-16: 10 mg via INTRAVENOUS
  Filled 2020-03-16: qty 2

## 2020-03-16 MED ORDER — DIPHENHYDRAMINE HCL 50 MG/ML IJ SOLN
25.0000 mg | Freq: Once | INTRAMUSCULAR | Status: AC
  Administered 2020-03-16: 25 mg via INTRAVENOUS
  Filled 2020-03-16: qty 1

## 2020-03-16 NOTE — ED Triage Notes (Signed)
Pt has a headache that began yesterday. Headache started behind her right eye that is now bilateral. Pt had an aneurysm in 1999 that began behind her right eye. She had a cancerous mass removed on the left side of her head previously.

## 2020-03-16 NOTE — ED Provider Notes (Signed)
[facility] EMERGENCY DEPARTMENT Provider Note   CSN: [id number] Arrival date & time: 03/16/20  0350     History Chief Complaint  Patient presents with  . Headache    Teresa Simon is a 84 y.o. female.  HPI   This patient is an 84 year old female, she has a history of an aneurysm that required clipping in 1999, she was having headaches prior to that time but has done extremely well since that time.  She reports that she was in her usual state of health until yesterday when she started to develop headaches while she was at work.  This started out gradual but became much more severe, she took approximately 6 tablets of Advil throughout her shift yesterday and when she got home last night she took another 800 mg of ibuprofen at 9:00 PM.  She reports that this helped her pain and she was able to go to bed but when she woke up this morning the pain was severe and diffuse.  She has not been nauseated, denies any changes in her vision at this time and denies any numbness or weakness of her arms or legs.  She does report that she has had some pain in her left arm with some pain that seems to be going up into her shoulder and her neck on the left side that has been going on over the week and ascending up into her neck.  There is no fevers or chills, no coughing or shortness of breath, no chest pain.  The patient reports that she has not had headaches in many years and this is reminiscent to her of prior aneurysm in that it is severe though she cannot recall what the prior headache felt like.  She has not been diagnosed with migraines and does not take any prophylactic or preventative medications a day-to-day basis  Past Medical History:  Diagnosis Date  . Aneurysm ([place])   . Hypercholesteremia     Patient Active Problem List   Diagnosis Date Noted  . Paresthesias   . Neuropathy 07/21/2015  . TIA (transient ischemic attack) 07/21/2015  . Aneurysm of anterior cerebral artery 07/21/2015     Past Surgical History:  Procedure Laterality Date  . CEREBRAL ANEURYSM REPAIR       OB History   No obstetric history on file.     No family history on file.  Social History   Tobacco Use  . Smoking status: Never Smoker  Substance Use Topics  . Alcohol use: No  . Drug use: No    Home Medications Prior to Admission medications   Medication Sig Start Date End Date Taking? Authorizing Provider  acetaminophen (TYLENOL) 500 MG tablet Take 1,000 mg by mouth every 6 (six) hours as needed for mild pain.    [provider]  Ascorbic Acid (VITAMIN C PO) Take 1 tablet by mouth daily.    [provider]  aspirin EC 325 MG tablet Take 1 tablet (325 mg total) by mouth daily. 07/22/15   Reyne Dumas, MD  BIOTIN PO Take 1 tablet by mouth daily.    [provider]  CALCIUM PO Take 1 tablet by mouth daily.    [provider]  Cholecalciferol (VITAMIN D PO) Take 1 tablet by mouth daily.    [provider]  Coenzyme Q10 (CO Q 10 PO) Take 1 tablet by mouth daily.    [provider]  fish oil-omega-3 fatty acids 1000 MG capsule 1 g 2 (two) times daily.  [provider]  glucosamine-chondroitin 500-400 MG tablet Take 1 tablet by mouth daily.    [provider]  HYDROcodone-acetaminophen (NORCO/VICODIN) 5-325 MG per tablet 1 tab PO q12hours prn pain 03/02/13   Francine Graven, DO  metoCLOPramide (REGLAN) 10 MG tablet Take 1 tablet (10 mg total) by mouth 3 (three) times daily as needed for nausea (headache / nausea). 03/16/20   Noemi Chapel, MD  Nutritional Supplements (JOINT FORMULA PO) Take 30 mLs by mouth daily.    [provider]  simvastatin (ZOCOR) 20 MG tablet Take 20 mg by mouth at bedtime.  02/22/13   [provider]  Sodium Chloride-Xylitol (XLEAR SINUS CARE SPRAY NA) Place 2 sprays into the nose 2 (two) times daily.    [provider]  VITAMIN E PO Take 1 tablet by mouth daily.     [provider]    Allergies    Niacin and related  Review of Systems   Review of Systems  All other systems reviewed and are negative.   Physical Exam Updated Vital Signs BP (!) 156/63   Pulse 83   Temp 98.5 F (36.9 C) (Oral)   Resp (!) 21   Ht 1.702 m (5\' 7" )   Wt 76.2 kg   SpO2 94%   BMI 26.31 kg/m   Physical Exam Vitals and nursing note reviewed.  Constitutional:      General: She is not in acute distress.    Appearance: She is well-developed.     Comments: Uncomfortable appearing, holding her head  HENT:     Head: Normocephalic and atraumatic.     Mouth/Throat:     Mouth: Mucous membranes are moist.     Pharynx: No oropharyngeal exudate.  Eyes:     General: No scleral icterus.       Right eye: No discharge.        Left eye: No discharge.     Extraocular Movements:     Right eye: Normal extraocular motion and no nystagmus.     Conjunctiva/sclera: Conjunctivae normal.     Pupils: Pupils are equal, round, and reactive to light.  Neck:     Thyroid: No thyromegaly.     Vascular: No JVD.  Cardiovascular:     Rate and Rhythm: Normal rate and regular rhythm.     Heart sounds: Normal heart sounds. No murmur heard.  No friction rub. No gallop.   Pulmonary:     Effort: Pulmonary effort is normal. No respiratory distress.     Breath sounds: Normal breath sounds. No wheezing or rales.  Abdominal:     General: Bowel sounds are normal. There is no distension.     Palpations: Abdomen is soft. There is no mass.     Tenderness: There is no abdominal tenderness.  Musculoskeletal:        General: No tenderness. Normal range of motion.     Cervical back: Normal range of motion and neck supple.  Lymphadenopathy:     Cervical: No cervical adenopathy.  Skin:    General: Skin is warm and dry.     Findings: No erythema or rash.  Neurological:     Mental Status: She is alert.     Coordination: Coordination normal.     Comments: This patient is able to move all  4 extremities with normal strength and sensation.  Normal strength at the bilateral hips and knees as well as bilateral grips.  Normal sensation diffusely, cranial nerves III through XII are normal.  Psychiatric:        Behavior: Behavior normal.     ED Results / Procedures / Treatments   Labs (all labs ordered are listed, but only abnormal results are displayed) Labs Reviewed  BASIC METABOLIC PANEL - Abnormal; Notable for the following components:      Result Value   Glucose, Bld 120 (*)    All other components within normal limits  RESPIRATORY PANEL BY RT PCR (FLU A&B, COVID)  CBC  PROTIME-INR    EKG EKG Interpretation  Date/Time:  Saturday March 16 2020 07:52:04 EDT Ventricular Rate:  87 PR Interval:    QRS Duration: 137 QT Interval:  394 QTC Calculation: 474 R Axis:   -22 Text Interpretation: Sinus rhythm Left bundle branch block Baseline wander in lead(s) V6 Since last tracing Left bundle branch block still present, rate faster Confirmed by Noemi Chapel [phone]) on 03/16/2020 8:01:12 AM   Radiology CT Angio Head W or Wo Contrast  Result Date: 03/16/2020 CLINICAL DATA:  Headache that began yesterday. History of aneurysm in 1999. EXAM: CT ANGIOGRAPHY HEAD TECHNIQUE: Multidetector CT imaging of the head was performed using the standard protocol during bolus administration of intravenous contrast. Multiplanar CT image reconstructions and MIPs were obtained to evaluate the vascular anatomy. CONTRAST:  51mL OMNIPAQUE IOHEXOL 350 MG/ML SOLN COMPARISON:  Head CT from earlier today FINDINGS: CT HEAD Not repeated CTA HEAD Anterior circulation: Atheromatous calcification along the bilateral carotid siphon without flow reducing stenosis, beading, or aneurysm. Clip along the supraclinoid right ICA aneurysm with associated streak artifact. No visible recurrence or additional aneurysm. No branch occlusion or stenosis seen. Posterior circulation: Codominant vertebral arteries. The  vertebral and basilar arteries are smooth and widely patent. No proximal branch occlusion or flow reducing stenosis. Venous sinuses: Diffusely patent Anatomic variants: None significant IMPRESSION: No emergent finding and no evidence of new or recurrent aneurysm. Electronically Signed   By: Monte Fantasia M.D.   On: 03/16/2020 10:15   CT Head Wo Contrast  Result Date: 03/16/2020 CLINICAL DATA:  Headache.  History of aneurysm in 1999 EXAM: CT HEAD WITHOUT CONTRAST TECHNIQUE: Contiguous axial images were obtained from the base of the skull through the vertex without intravenous contrast. COMPARISON:  Head CT dated 07/21/2015 FINDINGS: Brain: Ventricles are stable in size and configuration. There is no mass, hemorrhage, edema or other evidence of acute parenchymal abnormality appreciated. No extra-axial hemorrhage. Vascular: Chronic calcified atherosclerotic changes of the large vessels at the skull base. Aneurysm clip again noted at the level the RIGHT periclinoid ICA/MCA. No unexpected hyperdense vessel. Skull: No acute findings. Stable surgical changes of previous RIGHT frontotemporal craniotomy. Sinuses/Orbits: No acute finding. Other: None. IMPRESSION: 1. No acute findings. No intracranial mass, hemorrhage or edema. 2. Aneurysm clip again noted at the level of the RIGHT periclinoid ICA/MCA. Electronically Signed   By: Franki Cabot M.D.   On: 03/16/2020 09:01    Procedures Procedures (including critical care time)  Medications Ordered in ED Medications  metoCLOPramide (REGLAN) injection 10 mg (10 mg Intravenous Given 03/16/20 0839)  diphenhydrAMINE (BENADRYL) injection 25 mg (25 mg Intravenous Given 03/16/20 0838)  iohexol (OMNIPAQUE) 350 MG/ML injection 75 mL (75 mLs Intravenous Contrast Given 03/16/20 1000)    ED Course  I have reviewed the triage vital signs and the nursing notes.  Pertinent labs & imaging results that were available during my care of the patient were reviewed by me and  considered in my medical decision making (see chart for details).  MDM Rules/Calculators/A&P                          Clinically this patient has some concern for subarachnoid hemorrhage or possible aneurysm though she did have a CT angiogram in 2017 which did not show recurrent aneurysm.  Due to the severity of the headache and the patient's acute onset of pain which is severe I will get another CT scan of the brain, will also give some medication to treat for headache.  The patient is agreeable to the plan.  CT scan of the brain is negative for acute findings including aneurysm, bleeding or subarachnoid hemorrhage.  A CT angiogram of the brain also confirms no new aneurysms.  After being given the medications including Reglan and Benadryl the patient states that her headache is near completely resolved and she feels much better and is now requesting a work note for missing work today.  The patient is agreeable to follow-up with neurology, at this time is ready for discharge.  There is no signs of cancer, she has no indication is that this is related to infections such as fevers leukocytosis or stiff neck.  She has normal mental status  Final Clinical Impression(s) / ED Diagnoses Final diagnoses:  Acute nonintractable headache, unspecified headache type    Rx / DC Orders ED Discharge Orders         Ordered    metoCLOPramide (REGLAN) 10 MG tablet  3 times daily PRN        03/16/20 1103           Noemi Chapel, MD 03/16/20 1134

## 2020-03-16 NOTE — Discharge Instructions (Signed)
Your testing shows no signs of recurrent aneurysm You have improved with Reglan - you can take this at home as needed every 8 hours for headache If you have no improvement with 600mg  of ibuprofen, then try the reglan.  If the reglan makes you feel weird / stiff / anxious - take a benadryl.  ER for worsening headache See the Neurologist listed above for evaluation of your headache management.

## 2020-04-04 DIAGNOSIS — E785 Hyperlipidemia, unspecified: Secondary | ICD-10-CM | POA: Diagnosis not present

## 2020-04-04 DIAGNOSIS — Z0189 Encounter for other specified special examinations: Secondary | ICD-10-CM | POA: Diagnosis not present

## 2020-04-04 DIAGNOSIS — R7303 Prediabetes: Secondary | ICD-10-CM | POA: Diagnosis not present

## 2020-04-04 DIAGNOSIS — E782 Mixed hyperlipidemia: Secondary | ICD-10-CM | POA: Diagnosis not present

## 2020-04-04 DIAGNOSIS — K5900 Constipation, unspecified: Secondary | ICD-10-CM | POA: Diagnosis not present

## 2020-04-04 DIAGNOSIS — R7301 Impaired fasting glucose: Secondary | ICD-10-CM | POA: Diagnosis not present

## 2020-04-04 DIAGNOSIS — I2583 Coronary atherosclerosis due to lipid rich plaque: Secondary | ICD-10-CM | POA: Diagnosis not present

## 2020-04-04 DIAGNOSIS — I10 Essential (primary) hypertension: Secondary | ICD-10-CM | POA: Diagnosis not present

## 2020-04-11 DIAGNOSIS — I10 Essential (primary) hypertension: Secondary | ICD-10-CM | POA: Diagnosis not present

## 2020-04-11 DIAGNOSIS — R7301 Impaired fasting glucose: Secondary | ICD-10-CM | POA: Diagnosis not present

## 2020-04-11 DIAGNOSIS — Z01419 Encounter for gynecological examination (general) (routine) without abnormal findings: Secondary | ICD-10-CM | POA: Diagnosis not present

## 2020-04-11 DIAGNOSIS — E785 Hyperlipidemia, unspecified: Secondary | ICD-10-CM | POA: Diagnosis not present

## 2020-04-11 DIAGNOSIS — K5900 Constipation, unspecified: Secondary | ICD-10-CM | POA: Diagnosis not present

## 2020-04-11 DIAGNOSIS — R350 Frequency of micturition: Secondary | ICD-10-CM | POA: Diagnosis not present

## 2020-04-11 DIAGNOSIS — I2583 Coronary atherosclerosis due to lipid rich plaque: Secondary | ICD-10-CM | POA: Diagnosis not present

## 2020-04-11 DIAGNOSIS — Z1231 Encounter for screening mammogram for malignant neoplasm of breast: Secondary | ICD-10-CM | POA: Diagnosis not present

## 2020-04-11 DIAGNOSIS — Z0001 Encounter for general adult medical examination with abnormal findings: Secondary | ICD-10-CM | POA: Diagnosis not present

## 2020-06-13 ENCOUNTER — Ambulatory Visit: Payer: Medicare PPO | Admitting: Family Medicine

## 2020-06-27 ENCOUNTER — Encounter: Payer: Self-pay | Admitting: Internal Medicine

## 2020-06-27 ENCOUNTER — Other Ambulatory Visit: Payer: Self-pay

## 2020-06-27 ENCOUNTER — Ambulatory Visit (INDEPENDENT_AMBULATORY_CARE_PROVIDER_SITE_OTHER): Payer: Medicare PPO | Admitting: Internal Medicine

## 2020-06-27 VITALS — BP 146/76 | HR 71 | Temp 98.0°F | Resp 18 | Ht 67.0 in | Wt 166.8 lb

## 2020-06-27 DIAGNOSIS — I10 Essential (primary) hypertension: Secondary | ICD-10-CM | POA: Insufficient documentation

## 2020-06-27 DIAGNOSIS — I671 Cerebral aneurysm, nonruptured: Secondary | ICD-10-CM

## 2020-06-27 DIAGNOSIS — G459 Transient cerebral ischemic attack, unspecified: Secondary | ICD-10-CM | POA: Diagnosis not present

## 2020-06-27 DIAGNOSIS — C4490 Unspecified malignant neoplasm of skin, unspecified: Secondary | ICD-10-CM | POA: Insufficient documentation

## 2020-06-27 DIAGNOSIS — Z7689 Persons encountering health services in other specified circumstances: Secondary | ICD-10-CM | POA: Diagnosis not present

## 2020-06-27 DIAGNOSIS — M545 Low back pain, unspecified: Secondary | ICD-10-CM | POA: Diagnosis not present

## 2020-06-27 DIAGNOSIS — E785 Hyperlipidemia, unspecified: Secondary | ICD-10-CM | POA: Diagnosis not present

## 2020-06-27 LAB — POCT URINALYSIS DIP (CLINITEK)
Bilirubin, UA: NEGATIVE
Blood, UA: NEGATIVE
Glucose, UA: NEGATIVE mg/dL
Leukocytes, UA: NEGATIVE
Nitrite, UA: NEGATIVE
Spec Grav, UA: 1.025 (ref 1.010–1.025)
Urobilinogen, UA: 0.2 E.U./dL
pH, UA: 5 (ref 5.0–8.0)

## 2020-06-27 MED ORDER — SIMVASTATIN 20 MG PO TABS: 20.0000 mg | ORAL_TABLET | Freq: Every day | ORAL | 0 refills | Status: DC

## 2020-06-27 NOTE — Assessment & Plan Note (Signed)
Recently had excision Unsure of the type - BCC vs SCC Foollows up with Dermatologist

## 2020-06-27 NOTE — Patient Instructions (Signed)
Please continue to take Simvastatin as prescribed.  Please bring the blood test records in the next visit.  Please contact us if the back pain worsens or if you have new symptoms such as problems with urination or passing stool or any new pain or numbness in the legs.

## 2020-06-27 NOTE — Assessment & Plan Note (Signed)
On Aspirin and statin No focal neurologic deficits 

## 2020-06-27 NOTE — Assessment & Plan Note (Addendum)
Unclear etiology as it is more on left side, chronic without any radicular symptoms to LE currently No CVA tenderness Pain radiates left lower abdomen/pelvis sometimes UA negative for LE and nitrite Reports having [place] previously, will try to obtain records If persistent symptoms, will perform renal imaging to determine etiology Tylenol PRN

## 2020-06-27 NOTE — Progress Notes (Signed)
New Patient Office Visit  Subjective:  Patient ID: Teresa Simon, female    DOB: July 02, 1935  Age: 85 y.o. MRN: [id number]  CC:  Chief Complaint  Patient presents with  . New Patient (Initial Visit)    New patient has been having lower back pain for awhile went to ob gyn they ran test hasnt figured out what was going on     HPI Teresa Simon is a 85 year old female with PMH of cerebral aneurysm s/p clip, TIA and chronic back pain who presents for establishing care.  She has been having left flank/back pain for last 1 year, which is intermittent, dull, radiating to LLQ and relieved with Ibuprofen. Denies any numbness, tingling or weakness of LE. Denies any recent injury. Denies any dysuria, hematuria or urinary retention. Denies any constipation, melena or hematochezia. She reports that her OB/GYN has tried to work it up and has not been able to find the reason for it.  She recently had 2 skin lesions excised, 1 on scalp and 1 from the arm, which were cancerous, unsure of the type. She follows up with Dermatologist for it.  Her BP was elevated in the office today. But she states that she has tendency to have high BP in the office. Denies any headache, dizziness, chest pain, dyspnea or palpitations.  She is up-to-date with COVID, flu and Shingrix vaccine. She has had PCV13.  Past Medical History:  Diagnosis Date  . Aneurysm ([place])   . Hypercholesteremia     Past Surgical History:  Procedure Laterality Date  . CEREBRAL ANEURYSM REPAIR      History reviewed. No pertinent family history.  Social History   Socioeconomic History  . Marital status: Married    Spouse name: Not on file  . Number of children: Not on file  . Years of education: Not on file  . Highest education level: Not on file  Occupational History  . Not on file  Tobacco Use  . Smoking status: Never Smoker  . Smokeless tobacco: Never Used  Substance and Sexual Activity  . Alcohol use: No  . Drug use:  No  . Sexual activity: Yes    Birth control/protection: Other-see comments  Other Topics Concern  . Not on file  Social History Narrative  . Not on file   Social Determinants of Health   Financial Resource Strain: Not on file  Food Insecurity: Not on file  Transportation Needs: Not on file  Physical Activity: Not on file  Stress: Not on file  Social Connections: Not on file  Intimate Partner Violence: Not on file    ROS Review of Systems  Constitutional: Negative for chills and fever.  HENT: Negative for congestion, sinus pressure, sinus pain and sore throat.   Eyes: Negative for pain and discharge.  Respiratory: Negative for cough and shortness of breath.   Cardiovascular: Negative for chest pain and palpitations.  Gastrointestinal: Negative for abdominal pain, constipation, diarrhea, nausea and vomiting.  Endocrine: Negative for polydipsia and polyuria.  Genitourinary: Positive for flank pain. Negative for dysuria and hematuria.  Musculoskeletal: Positive for back pain. Negative for neck pain and neck stiffness.  Skin: Negative for rash.  Neurological: Negative for dizziness and weakness.  Psychiatric/Behavioral: Negative for agitation and behavioral problems.    Objective:   Today's Vitals: BP (!) 146/76 (BP Location: Right Arm, Patient Position: Sitting, Cuff Size: Normal)   Pulse 71   Temp 98 F (36.7 C) (Oral)   Resp 18   Ht 5'  7" (1.702 m)   Wt 166 lb 12.8 oz (75.7 kg)   SpO2 96%   BMI 26.12 kg/m   Physical Exam Vitals reviewed.  Constitutional:      General: She is not in acute distress.    Appearance: She is not diaphoretic.  HENT:     Head: Normocephalic and atraumatic.     Nose: Nose normal.     Mouth/Throat:     Mouth: Mucous membranes are moist.  Eyes:     General: No scleral icterus.    Extraocular Movements: Extraocular movements intact.     Pupils: Pupils are equal, round, and reactive to light.  Cardiovascular:     Rate and Rhythm:  Normal rate and regular rhythm.     Pulses: Normal pulses.     Heart sounds: Normal heart sounds. No murmur heard.   Pulmonary:     Breath sounds: Normal breath sounds. No wheezing or rales.  Abdominal:     Palpations: Abdomen is soft.     Tenderness: There is no abdominal tenderness. There is no right CVA tenderness, left CVA tenderness, guarding or rebound.  Musculoskeletal:     Cervical back: Neck supple. No tenderness.     Lumbar back: No swelling or bony tenderness.     Right lower leg: No edema.     Left lower leg: No edema.  Skin:    General: Skin is warm.     Findings: No rash.  Neurological:     General: No focal deficit present.     Mental Status: She is alert and oriented to person, place, and time.     Sensory: No sensory deficit.     Motor: No weakness.  Psychiatric:        Mood and Affect: Mood normal.        Behavior: Behavior normal.     Assessment & Plan:   Problem List Items Addressed This Visit      Encounter to establish care - Primary   Care established Previous chart reviewed History and medications reviewed with the patient      Cardiovascular and Mediastinum   TIA (transient ischemic attack)    On Aspirin and statin No focal neurologic deficits      Relevant Medications   simvastatin (ZOCOR) 20 MG tablet   Aneurysm of anterior cerebral artery    s/p surgery with clip and craniotomy Had a recent episode of headache, but unrelated to aneurysm according to ER workup - no episode of headache since ER visit in 02/2020      Relevant Medications   simvastatin (ZOCOR) 20 MG tablet  High BP Will check BP in the next visit If persistently elevated, will discuss medications Appears to have a component of white coat HTN.     Musculoskeletal and Integument   Skin cancer    Recently had excision Unsure of the type - BCC vs SCC Foollows up with Dermatologist        Other         Low back pain    Unclear etiology as it is more on left  side, chronic without any radicular symptoms to LE currently No CVA tenderness Pain radiates left lower abdomen/pelvis sometimes UA negative for LE and nitrite Reports having [place] previously, will try to obtain records If persistent symptoms, will perform renal imaging to determine etiology Tylenol PRN      Relevant Orders   POCT URINALYSIS DIP (CLINITEK) (Completed)   Hyperlipidemia    On  Simvastatin      Relevant Medications   simvastatin (ZOCOR) 20 MG tablet      Outpatient Encounter Medications as of 06/27/2020  Medication Sig  . [DISCONTINUED] simvastatin (ZOCOR) 20 MG tablet Take 20 mg by mouth at bedtime.   . simvastatin (ZOCOR) 20 MG tablet Take 1 tablet (20 mg total) by mouth at bedtime.  . [DISCONTINUED] acetaminophen (TYLENOL) 500 MG tablet Take 1,000 mg by mouth every 6 (six) hours as needed for mild pain. (Patient not taking: Reported on 06/27/2020)  . [DISCONTINUED] Ascorbic Acid (VITAMIN C PO) Take 1 tablet by mouth daily. (Patient not taking: Reported on 06/27/2020)  . [DISCONTINUED] aspirin EC 325 MG tablet Take 1 tablet (325 mg total) by mouth daily. (Patient not taking: Reported on 06/27/2020)  . [DISCONTINUED] BIOTIN PO Take 1 tablet by mouth daily. (Patient not taking: Reported on 06/27/2020)  . [DISCONTINUED] CALCIUM PO Take 1 tablet by mouth daily. (Patient not taking: Reported on 06/27/2020)  . [DISCONTINUED] Cholecalciferol (VITAMIN D PO) Take 1 tablet by mouth daily. (Patient not taking: Reported on 06/27/2020)  . [DISCONTINUED] Coenzyme Q10 (CO Q 10 PO) Take 1 tablet by mouth daily. (Patient not taking: Reported on 06/27/2020)  . [DISCONTINUED] fish oil-omega-3 fatty acids 1000 MG capsule 1 g 2 (two) times daily. (Patient not taking: Reported on 06/27/2020)  . [DISCONTINUED] glucosamine-chondroitin 500-400 MG tablet Take 1 tablet by mouth daily. (Patient not taking: Reported on 06/27/2020)  . [DISCONTINUED] HYDROcodone-acetaminophen (NORCO/VICODIN) 5-325 MG per tablet 1 tab PO  q12hours prn pain (Patient not taking: Reported on 06/27/2020)  . [DISCONTINUED] metoCLOPramide (REGLAN) 10 MG tablet Take 1 tablet (10 mg total) by mouth 3 (three) times daily as needed for nausea (headache / nausea). (Patient not taking: Reported on 06/27/2020)  . [DISCONTINUED] Nutritional Supplements (JOINT FORMULA PO) Take 30 mLs by mouth daily. (Patient not taking: Reported on 06/27/2020)  . [DISCONTINUED] Sodium Chloride-Xylitol (XLEAR SINUS CARE SPRAY NA) Place 2 sprays into the nose 2 (two) times daily. (Patient not taking: Reported on 06/27/2020)  . [DISCONTINUED] VITAMIN E PO Take 1 tablet by mouth daily. (Patient not taking: Reported on 06/27/2020)   No facility-administered encounter medications on file as of 06/27/2020.    Follow-up: Return in about 6 weeks (around 08/08/2020) for Back pain and blood pressure check.   Lindell Spar, MD

## 2020-06-27 NOTE — Assessment & Plan Note (Signed)
s/p surgery with clip and craniotomy Had a recent episode of headache, but unrelated to aneurysm according to ER workup - no episode of headache since ER visit in 02/2020

## 2020-06-27 NOTE — Assessment & Plan Note (Signed)
On Simvastatin 

## 2020-06-27 NOTE — Assessment & Plan Note (Signed)
Care established Previous chart reviewed History and medications reviewed with the patient 

## 2020-07-04 DIAGNOSIS — D23111 Other benign neoplasm of skin of right upper eyelid, including canthus: Secondary | ICD-10-CM | POA: Diagnosis not present

## 2020-07-04 DIAGNOSIS — D2339 Other benign neoplasm of skin of other parts of face: Secondary | ICD-10-CM | POA: Diagnosis not present

## 2020-07-04 DIAGNOSIS — D230 Other benign neoplasm of skin of lip: Secondary | ICD-10-CM | POA: Diagnosis not present

## 2020-07-04 DIAGNOSIS — D234 Other benign neoplasm of skin of scalp and neck: Secondary | ICD-10-CM | POA: Diagnosis not present

## 2020-07-04 DIAGNOSIS — D23121 Other benign neoplasm of skin of left upper eyelid, including canthus: Secondary | ICD-10-CM | POA: Diagnosis not present

## 2020-07-04 DIAGNOSIS — Z719 Counseling, unspecified: Secondary | ICD-10-CM | POA: Diagnosis not present

## 2020-07-04 DIAGNOSIS — Z1283 Encounter for screening for malignant neoplasm of skin: Secondary | ICD-10-CM | POA: Diagnosis not present

## 2020-07-04 DIAGNOSIS — Z872 Personal history of diseases of the skin and subcutaneous tissue: Secondary | ICD-10-CM | POA: Diagnosis not present

## 2020-07-04 DIAGNOSIS — Z85828 Personal history of other malignant neoplasm of skin: Secondary | ICD-10-CM | POA: Diagnosis not present

## 2020-08-08 ENCOUNTER — Other Ambulatory Visit: Payer: Self-pay

## 2020-08-08 ENCOUNTER — Ambulatory Visit (INDEPENDENT_AMBULATORY_CARE_PROVIDER_SITE_OTHER): Payer: Medicare PPO | Admitting: Internal Medicine

## 2020-08-08 ENCOUNTER — Encounter: Payer: Self-pay | Admitting: Internal Medicine

## 2020-08-08 VITALS — BP 127/67 | HR 69 | Resp 18 | Ht 67.0 in | Wt 164.4 lb

## 2020-08-08 DIAGNOSIS — I10 Essential (primary) hypertension: Secondary | ICD-10-CM

## 2020-08-08 DIAGNOSIS — M545 Low back pain, unspecified: Secondary | ICD-10-CM | POA: Diagnosis not present

## 2020-08-08 DIAGNOSIS — E785 Hyperlipidemia, unspecified: Secondary | ICD-10-CM | POA: Diagnosis not present

## 2020-08-08 MED ORDER — SIMVASTATIN 20 MG PO TABS: 20.0000 mg | ORAL_TABLET | Freq: Every day | ORAL | 3 refills | Status: DC

## 2020-08-08 NOTE — Assessment & Plan Note (Signed)
Resolved now Could be MSK in etiology Tylenol PRN

## 2020-08-08 NOTE — Patient Instructions (Signed)
Continue taking Simvastatin as prescribed.  Please contact us if you have any new complaints or concern.

## 2020-08-08 NOTE — Assessment & Plan Note (Signed)
BP wnl today °Continue low salt diet and activity as tolerated °

## 2020-08-08 NOTE — Progress Notes (Signed)
Established Patient Office Visit  Subjective:  Patient ID: Teresa Simon, female    DOB: 1935-08-22  Age: 85 y.o. MRN: [id number]  CC:  Chief Complaint  Patient presents with  . Follow-up    Follow up htn and back pain back pain is better     HPI Teresa Simon is a 85 year old female with PMH of cerebral aneurysm s/p clip, TIA and chronic back pain who presents for follow up of her BP and back pain.  Her BP is normal today. She checks her BP at home and has been within normal limits most of the time. Denies any headache, dizziness, chest pain, dyspnea or palpitations.  Her back pain has improved now. Denies any dysuria or hematuria.  Past Medical History:  Diagnosis Date  . Aneurysm ([place])   . Hypercholesteremia     Past Surgical History:  Procedure Laterality Date  . CEREBRAL ANEURYSM REPAIR      History reviewed. No pertinent family history.  Social History   Socioeconomic History  . Marital status: Married    Spouse name: Not on file  . Number of children: Not on file  . Years of education: Not on file  . Highest education level: Not on file  Occupational History  . Not on file  Tobacco Use  . Smoking status: Never Smoker  . Smokeless tobacco: Never Used  Substance and Sexual Activity  . Alcohol use: No  . Drug use: No  . Sexual activity: Yes    Birth control/protection: Other-see comments  Other Topics Concern  . Not on file  Social History Narrative  . Not on file   Social Determinants of Health   Financial Resource Strain: Not on file  Food Insecurity: Not on file  Transportation Needs: Not on file  Physical Activity: Not on file  Stress: Not on file  Social Connections: Not on file  Intimate Partner Violence: Not on file    Outpatient Medications Prior to Visit  Medication Sig Dispense Refill  . simvastatin (ZOCOR) 20 MG tablet Take 1 tablet (20 mg total) by mouth at bedtime. 90 tablet 0   No facility-administered medications prior  to visit.    Allergies  Allergen Reactions  . Niacin And Related Other (See Comments)    Redness, burning sensation    ROS Review of Systems  Constitutional: Negative for chills and fever.  HENT: Negative for congestion, sinus pressure, sinus pain and sore throat.   Eyes: Negative for pain and discharge.  Respiratory: Negative for cough and shortness of breath.   Cardiovascular: Negative for chest pain and palpitations.  Gastrointestinal: Negative for abdominal pain, constipation, diarrhea, nausea and vomiting.  Endocrine: Negative for polydipsia and polyuria.  Genitourinary: Negative for dysuria, flank pain and hematuria.  Musculoskeletal: Positive for back pain (Chronic). Negative for neck pain and neck stiffness.  Skin: Negative for rash.  Neurological: Negative for dizziness and weakness.  Psychiatric/Behavioral: Negative for agitation and behavioral problems.      Objective:    Physical Exam Vitals reviewed.  Constitutional:      General: She is not in acute distress.    Appearance: She is not diaphoretic.  HENT:     Head: Normocephalic and atraumatic.     Nose: Nose normal. No congestion.     Mouth/Throat:     Mouth: Mucous membranes are moist.     Pharynx: No posterior oropharyngeal erythema.  Eyes:     General: No scleral icterus.    Extraocular Movements: Extraocular movements  intact.  Cardiovascular:     Rate and Rhythm: Normal rate and regular rhythm.     Pulses: Normal pulses.     Heart sounds: Normal heart sounds. No murmur heard.   Pulmonary:     Breath sounds: Normal breath sounds. No wheezing or rales.  Musculoskeletal:     Cervical back: Neck supple. No tenderness.     Lumbar back: No swelling or bony tenderness.     Right lower leg: No edema.     Left lower leg: No edema.  Skin:    General: Skin is warm.     Findings: No rash.  Neurological:     General: No focal deficit present.     Mental Status: She is alert and oriented to person,  place, and time.     Sensory: No sensory deficit.     Motor: No weakness.  Psychiatric:        Mood and Affect: Mood normal.        Behavior: Behavior normal.     BP 127/67 (BP Location: Left Arm, Patient Position: Sitting, Cuff Size: Normal)   Pulse 69   Resp 18   Ht 5\' 7"  (1.702 m)   Wt 164 lb 6.4 oz (74.6 kg)   SpO2 96%   BMI 25.75 kg/m  Wt Readings from Last 3 Encounters:  08/08/20 164 lb 6.4 oz (74.6 kg)  06/27/20 166 lb 12.8 oz (75.7 kg)  03/16/20 168 lb (76.2 kg)     Health Maintenance Due  Topic Date Due  . DEXA SCAN  Never done  . PNA vac Low Risk Adult (1 of 2 - PCV13) Never done    There are no preventive care reminders to display for this patient.  Lab Results  Component Value Date   TSH 1.977 07/21/2015   Lab Results  Component Value Date   WBC 9.6 03/16/2020   HGB 13.8 03/16/2020   HCT 40.6 03/16/2020   MCV 91.2 03/16/2020   PLT 228 03/16/2020   Lab Results  Component Value Date   NA 138 03/16/2020   K 4.2 03/16/2020   CO2 27 03/16/2020   GLUCOSE 120 (H) 03/16/2020   BUN 15 03/16/2020   CREATININE 0.84 03/16/2020   BILITOT 0.5 07/21/2015   ALKPHOS 44 07/21/2015   AST 23 07/21/2015   ALT 21 07/21/2015   PROT 7.5 07/21/2015   ALBUMIN 4.6 07/21/2015   CALCIUM 9.3 03/16/2020   ANIONGAP 9 03/16/2020   No results found for: CHOL No results found for: HDL No results found for: LDLCALC No results found for: TRIG No results found for: CHOLHDL No results found for: HGBA1C    Assessment & Plan:   Problem List Items Addressed This Visit      Cardiovascular and Mediastinum   BP (high blood pressure)    BP wnl today Continue low salt diet and activity as tolerated      Relevant Medications   simvastatin (ZOCOR) 20 MG tablet     Other   Low back pain - Primary    Resolved now Could be MSK in etiology Tylenol PRN      Hyperlipidemia    On Simvastatin      Relevant Medications   simvastatin (ZOCOR) 20 MG tablet      Meds  ordered this encounter  Medications  . simvastatin (ZOCOR) 20 MG tablet    Sig: Take 1 tablet (20 mg total) by mouth at bedtime.    Dispense:  90 tablet  Refill:  3    Follow-up: Return in about 9 months (around 05/10/2021) for Annual physical.    Lindell Spar, MD

## 2020-08-08 NOTE — Assessment & Plan Note (Signed)
On Simvastatin 

## 2020-10-11 ENCOUNTER — Telehealth: Payer: Self-pay | Admitting: Internal Medicine

## 2020-10-11 NOTE — Telephone Encounter (Signed)
Left message for patient to call back and schedule Medicare Annual Wellness Visit (AWV) either virtually or in office.   AWV-I PER [place] 05/25/2009  please schedule at anytime with [facility]  health coach  This should be a 40 minute visit.

## 2021-01-30 ENCOUNTER — Telehealth: Payer: Self-pay | Admitting: Internal Medicine

## 2021-01-30 NOTE — Telephone Encounter (Signed)
  Left message for patient to call back and schedule Medicare Annual Wellness Visit (AWV) in office.   If unable to come into the office for AWV,  please offer to do virtually or by telephone.  No hx of AWV eligible for AWVI as of 05/25/2009  Please schedule at anytime with [facility].      40 Minutes appointment   Any questions, please call me at [phone]

## 2021-04-10 ENCOUNTER — Other Ambulatory Visit: Payer: Self-pay

## 2021-04-10 ENCOUNTER — Encounter: Payer: Self-pay | Admitting: Internal Medicine

## 2021-04-10 ENCOUNTER — Ambulatory Visit (INDEPENDENT_AMBULATORY_CARE_PROVIDER_SITE_OTHER): Payer: Medicare PPO | Admitting: Internal Medicine

## 2021-04-10 VITALS — BP 158/82 | HR 65 | Ht 67.0 in | Wt 163.0 lb

## 2021-04-10 DIAGNOSIS — Z0001 Encounter for general adult medical examination with abnormal findings: Secondary | ICD-10-CM | POA: Diagnosis not present

## 2021-04-10 DIAGNOSIS — E559 Vitamin D deficiency, unspecified: Secondary | ICD-10-CM

## 2021-04-10 DIAGNOSIS — M545 Low back pain, unspecified: Secondary | ICD-10-CM | POA: Diagnosis not present

## 2021-04-10 DIAGNOSIS — B351 Tinea unguium: Secondary | ICD-10-CM

## 2021-04-10 DIAGNOSIS — I671 Cerebral aneurysm, nonruptured: Secondary | ICD-10-CM | POA: Diagnosis not present

## 2021-04-10 DIAGNOSIS — I10 Essential (primary) hypertension: Secondary | ICD-10-CM | POA: Diagnosis not present

## 2021-04-10 DIAGNOSIS — Z131 Encounter for screening for diabetes mellitus: Secondary | ICD-10-CM | POA: Diagnosis not present

## 2021-04-10 DIAGNOSIS — E785 Hyperlipidemia, unspecified: Secondary | ICD-10-CM | POA: Diagnosis not present

## 2021-04-10 MED ORDER — CYCLOBENZAPRINE HCL 5 MG PO TABS: 5.0000 mg | ORAL_TABLET | Freq: Three times a day (TID) | ORAL | 1 refills | Status: DC | PRN

## 2021-04-10 MED ORDER — NAPROXEN 500 MG PO TABS: 500.0000 mg | ORAL_TABLET | Freq: Two times a day (BID) | ORAL | 0 refills | Status: DC

## 2021-04-10 MED ORDER — TERBINAFINE HCL 1 % EX CREA: 1.0000 "application " | TOPICAL_CREAM | Freq: Two times a day (BID) | CUTANEOUS | 0 refills | Status: DC

## 2021-04-10 NOTE — Patient Instructions (Signed)
Please take Naproxen for back pain. Please take Cyclobenzaprine for muscle spasms.  Avoid heavy lifting.  Please avoid frequent bending.  Please apply Lamisil cream for toenail infection.

## 2021-04-11 LAB — COMPREHENSIVE METABOLIC PANEL
ALT: 25 IU/L (ref 0–32)
AST: 35 IU/L (ref 0–40)
Albumin/Globulin Ratio: 1.9 (ref 1.2–2.2)
Albumin: 4.8 g/dL — ABNORMAL HIGH (ref 3.6–4.6)
Alkaline Phosphatase: 46 IU/L (ref 44–121)
BUN/Creatinine Ratio: 15 (ref 12–28)
BUN: 13 mg/dL (ref 8–27)
Bilirubin Total: 0.6 mg/dL (ref 0.0–1.2)
CO2: 26 mmol/L (ref 20–29)
Calcium: 10.3 mg/dL (ref 8.7–10.3)
Chloride: 101 mmol/L (ref 96–106)
Creatinine, Ser: 0.88 mg/dL (ref 0.57–1.00)
Globulin, Total: 2.5 g/dL (ref 1.5–4.5)
Glucose: 93 mg/dL (ref 70–99)
Potassium: 5 mmol/L (ref 3.5–5.2)
Sodium: 140 mmol/L (ref 134–144)
Total Protein: 7.3 g/dL (ref 6.0–8.5)
eGFR: 66 mL/min/{1.73_m2} (ref >59)

## 2021-04-11 LAB — CBC WITH DIFFERENTIAL/PLATELET
Basophils Absolute: 0.1 10*3/uL (ref 0.0–0.2)
Basos: 1 %
EOS (ABSOLUTE): 0.2 10*3/uL (ref 0.0–0.4)
Eos: 2 %
Hematocrit: 41.6 % (ref 34.0–46.6)
Hemoglobin: 14.1 g/dL (ref 11.1–15.9)
Immature Grans (Abs): 0 10*3/uL (ref 0.0–0.1)
Immature Granulocytes: 0 %
Lymphocytes Absolute: 2.4 10*3/uL (ref 0.7–3.1)
Lymphs: 34 %
MCH: 30.8 pg (ref 26.6–33.0)
MCHC: 33.9 g/dL (ref 31.5–35.7)
MCV: 91 fL (ref 79–97)
Monocytes Absolute: 0.8 10*3/uL (ref 0.1–0.9)
Monocytes: 11 %
Neutrophils Absolute: 3.7 10*3/uL (ref 1.4–7.0)
Neutrophils: 52 %
Platelets: 225 10*3/uL (ref 150–450)
RBC: 4.58 x10E6/uL (ref 3.77–5.28)
RDW: 12 % (ref 11.7–15.4)
WBC: 7.1 10*3/uL (ref 3.4–10.8)

## 2021-04-11 LAB — LIPID PANEL
Chol/HDL Ratio: 2.2 ratio (ref 0.0–4.4)
Cholesterol, Total: 157 mg/dL (ref 100–199)
HDL: 73 mg/dL (ref >39)
LDL Chol Calc (NIH): 67 mg/dL (ref 0–99)
Triglycerides: 94 mg/dL (ref 0–149)
VLDL Cholesterol Cal: 17 mg/dL (ref 5–40)

## 2021-04-11 LAB — HEMOGLOBIN A1C
Est. average glucose Bld gHb Est-mCnc: 126 mg/dL
Hgb A1c MFr Bld: 6 % — ABNORMAL HIGH (ref 4.8–5.6)

## 2021-04-11 LAB — TSH: TSH: 1.08 u[IU]/mL (ref 0.450–4.500)

## 2021-04-11 LAB — VITAMIN D 25 HYDROXY (VIT D DEFICIENCY, FRACTURES): Vit D, 25-Hydroxy: 33.4 ng/mL (ref 30.0–100.0)

## 2021-04-11 NOTE — Assessment & Plan Note (Signed)
Chronic, no injury recently Could be DDD of lumbar spine Avoid heavy lifting and frequent bending Naproxen and Flexeril PRN

## 2021-04-11 NOTE — Assessment & Plan Note (Signed)
Started Lamisil cream

## 2021-04-11 NOTE — Assessment & Plan Note (Signed)
BP Readings from Last 1 Encounters:  04/10/21 (!) 158/82   New-onset Could be elevated due to pain, will recheck after 6 weeks Advised DASH diet and moderate exercise/walking as tolerated

## 2021-04-11 NOTE — Assessment & Plan Note (Signed)
Annual exam as documented. Counseling done  re healthy lifestyle involving commitment to 150 minutes exercise per week, heart healthy diet, and attaining healthy weight.The importance of adequate sleep also discussed. Changes in health habits are decided on by the patient with goals and time frames  set for achieving them. Immunization and cancer screening needs are specifically addressed at this visit. 

## 2021-04-11 NOTE — Progress Notes (Signed)
Established Patient Office Visit  Subjective:  Patient ID: Teresa Simon, female    DOB: 05-Sep-1935  Age: 85 y.o. MRN: [id number]  CC:  Chief Complaint  Patient presents with   Annual Exam    Annual exam - Patient would like complete blood work. Still having back pain    HPI Teresa Simon is a 85 y.o. female with past medical history of cerebral aneurysm s/p clip, TIA and chronic back pain who presents for annual physical.  She has been having left flank/back pain for last 1 year, which is intermittent, dull, radiating to LLQ and relieved with Ibuprofen. Denies any numbness, tingling or weakness of LE. Denies any recent injury. Denies any dysuria, hematuria or urinary retention. She has tried using OTC spray for pain and spasm, which have relieved her pain.  Her BP was elevated in the office today. But she states that she has tendency to have high BP in the office. Denies any headache, dizziness, chest pain, dyspnea or palpitations.      Past Medical History:  Diagnosis Date   Aneurysm ([place])    Hypercholesteremia     Past Surgical History:  Procedure Laterality Date   CEREBRAL ANEURYSM REPAIR      History reviewed. No pertinent family history.  Social History   Socioeconomic History   Marital status: Married    Spouse name: Not on file   Number of children: Not on file   Years of education: Not on file   Highest education level: Not on file  Occupational History   Not on file  Tobacco Use   Smoking status: Never   Smokeless tobacco: Never  Substance and Sexual Activity   Alcohol use: No   Drug use: No   Sexual activity: Yes    Birth control/protection: Other-see comments  Other Topics Concern   Not on file  Social History Narrative   Not on file   Social Determinants of Health   Financial Resource Strain: Not on file  Food Insecurity: Not on file  Transportation Needs: Not on file  Physical Activity: Not on file  Stress: Not on file  Social  Connections: Not on file  Intimate Partner Violence: Not on file    Outpatient Medications Prior to Visit  Medication Sig Dispense Refill   simvastatin (ZOCOR) 20 MG tablet Take 1 tablet (20 mg total) by mouth at bedtime. 90 tablet 3   No facility-administered medications prior to visit.    Allergies  Allergen Reactions   Niacin And Related Other (See Comments)    Redness, burning sensation    ROS Review of Systems  Constitutional:  Negative for chills and fever.  HENT:  Negative for congestion, sinus pressure, sinus pain and sore throat.   Eyes:  Negative for pain and discharge.  Respiratory:  Negative for cough and shortness of breath.   Cardiovascular:  Negative for chest pain and palpitations.  Gastrointestinal:  Negative for abdominal pain, constipation, diarrhea, nausea and vomiting.  Endocrine: Negative for polydipsia and polyuria.  Genitourinary:  Negative for dysuria, flank pain and hematuria.  Musculoskeletal:  Positive for back pain (Chronic). Negative for neck pain and neck stiffness.  Skin:  Negative for rash.       Nail fungus  Neurological:  Negative for dizziness and weakness.  Psychiatric/Behavioral:  Negative for agitation and behavioral problems.      Objective:    Physical Exam Vitals reviewed.  Constitutional:      General: She is not in acute distress.  Appearance: She is not diaphoretic.  HENT:     Head: Normocephalic and atraumatic.     Nose: Nose normal. No congestion.     Mouth/Throat:     Mouth: Mucous membranes are moist.     Pharynx: No posterior oropharyngeal erythema.  Eyes:     General: No scleral icterus.    Extraocular Movements: Extraocular movements intact.  Cardiovascular:     Rate and Rhythm: Normal rate and regular rhythm.     Pulses: Normal pulses.     Heart sounds: Normal heart sounds. No murmur heard. Pulmonary:     Breath sounds: Normal breath sounds. No wheezing or rales.  Abdominal:     Palpations: Abdomen is  soft.     Tenderness: There is no abdominal tenderness.  Musculoskeletal:     Cervical back: Neck supple. No tenderness.     Lumbar back: No swelling or bony tenderness.     Right lower leg: No edema.     Left lower leg: No edema.  Feet:     Right foot:     Toenail Condition: Fungal disease present.    Left foot:     Toenail Condition: Fungal disease present. Skin:    General: Skin is warm.     Findings: No rash.  Neurological:     General: No focal deficit present.     Mental Status: She is alert and oriented to person, place, and time.     Cranial Nerves: No cranial nerve deficit.     Sensory: No sensory deficit.     Motor: No weakness.  Psychiatric:        Mood and Affect: Mood normal.        Behavior: Behavior normal.    BP (!) 158/82 (BP Location: Left Arm, Cuff Size: Normal)   Pulse 65   Ht 5\' 7"  (1.702 m)   Wt 163 lb (73.9 kg)   SpO2 94%   BMI 25.53 kg/m  Wt Readings from Last 3 Encounters:  04/10/21 163 lb (73.9 kg)  08/08/20 164 lb 6.4 oz (74.6 kg)  06/27/20 166 lb 12.8 oz (75.7 kg)    Lab Results  Component Value Date   TSH 1.977 07/21/2015   Lab Results  Component Value Date   WBC 9.6 03/16/2020   HGB 13.8 03/16/2020   HCT 40.6 03/16/2020   MCV 91.2 03/16/2020   PLT 228 03/16/2020   Lab Results  Component Value Date   NA 138 03/16/2020   K 4.2 03/16/2020   CO2 27 03/16/2020   GLUCOSE 120 (H) 03/16/2020   BUN 15 03/16/2020   CREATININE 0.84 03/16/2020   BILITOT 0.5 07/21/2015   ALKPHOS 44 07/21/2015   AST 23 07/21/2015   ALT 21 07/21/2015   PROT 7.5 07/21/2015   ALBUMIN 4.6 07/21/2015   CALCIUM 9.3 03/16/2020   ANIONGAP 9 03/16/2020   No results found for: CHOL No results found for: HDL No results found for: LDLCALC No results found for: TRIG No results found for: CHOLHDL No results found for: HGBA1C    Assessment & Plan:   Problem List Items Addressed This Visit       Encounter for general adult medical examination with  abnormal findings - Primary   Annual exam as documented. Counseling done  re healthy lifestyle involving commitment to 150 minutes exercise per week, heart healthy diet, and attaining healthy weight.The importance of adequate sleep also discussed. Changes in health habits are decided on by the patient with goals  and time frames  set for achieving them. Immunization and cancer screening needs are specifically addressed at this visit.     Relevant Orders  CBC with Differential  Comprehensive metabolic panel  Hemoglobin A1c  Lipid panel  TSH    Cardiovascular and Mediastinum   Essential hypertension    BP Readings from Last 1 Encounters:  04/10/21 (!) 158/82  New-onset Could be elevated due to pain, will recheck after 6 weeks Advised DASH diet and moderate exercise/walking as tolerated         Musculoskeletal and Integument   Onychomycosis    Started Lamisil cream      Relevant Medications   terbinafine (LAMISIL AT) 1 % cream     Other   Low back pain    Chronic, no injury recently Could be DDD of lumbar spine Avoid heavy lifting and frequent bending Naproxen and Flexeril PRN      Relevant Medications   naproxen (NAPROSYN) 500 MG tablet   cyclobenzaprine (FLEXERIL) 5 MG tablet   Other Visit Diagnoses     Vitamin D deficiency       Relevant Orders   Vitamin D (25 hydroxy)       Meds ordered this encounter  Medications   naproxen (NAPROSYN) 500 MG tablet    Sig: Take 1 tablet (500 mg total) by mouth 2 (two) times daily with a meal.    Dispense:  30 tablet    Refill:  0   cyclobenzaprine (FLEXERIL) 5 MG tablet    Sig: Take 1 tablet (5 mg total) by mouth 3 (three) times daily as needed for muscle spasms.    Dispense:  30 tablet    Refill:  1   terbinafine (LAMISIL AT) 1 % cream    Sig: Apply 1 application topically 2 (two) times daily.    Dispense:  30 g    Refill:  0    Follow-up: Return in about 6 months (around 10/08/2021).    Lindell Spar,  MD

## 2021-05-13 DIAGNOSIS — Z1231 Encounter for screening mammogram for malignant neoplasm of breast: Secondary | ICD-10-CM | POA: Diagnosis not present

## 2021-05-29 ENCOUNTER — Ambulatory Visit (INDEPENDENT_AMBULATORY_CARE_PROVIDER_SITE_OTHER): Payer: Medicare PPO | Admitting: Internal Medicine

## 2021-05-29 ENCOUNTER — Ambulatory Visit (HOSPITAL_COMMUNITY)
Admission: RE | Admit: 2021-05-29 | Discharge: 2021-05-29 | Disposition: A | Discharge status: Home / Self Care | Payer: Medicare PPO | Source: Ambulatory Visit | Attending: Internal Medicine | Admitting: Internal Medicine

## 2021-05-29 ENCOUNTER — Encounter: Payer: Self-pay | Admitting: Internal Medicine

## 2021-05-29 ENCOUNTER — Other Ambulatory Visit: Payer: Self-pay

## 2021-05-29 VITALS — BP 140/78 | HR 80 | Temp 98.0°F | Ht 66.93 in | Wt 166.1 lb

## 2021-05-29 DIAGNOSIS — G8929 Other chronic pain: Secondary | ICD-10-CM

## 2021-05-29 DIAGNOSIS — I10 Essential (primary) hypertension: Secondary | ICD-10-CM

## 2021-05-29 DIAGNOSIS — Z780 Asymptomatic menopausal state: Secondary | ICD-10-CM

## 2021-05-29 DIAGNOSIS — M545 Low back pain, unspecified: Secondary | ICD-10-CM

## 2021-05-29 NOTE — Assessment & Plan Note (Signed)
Chronic, no injury recently Could be DDD of lumbar spine Avoid heavy lifting and frequent bending Flexeril PRN Check X-ray lumbar spine

## 2021-05-29 NOTE — Patient Instructions (Signed)
Please follow low salt diet and ambulate as tolerated.  Okay to take Tylenol as needed for pain.  Please get X-ray of lumbar spine done at [facility].

## 2021-05-29 NOTE — Progress Notes (Signed)
Acute Office Visit  Subjective:    Patient ID: Teresa Simon, female    DOB: 1935-10-25, 86 y.o.   MRN: [id number]  Chief Complaint  Patient presents with   Hypertension    6 wk HTN follow up    HPI Patient is in today for f/u of HTN. Her BP was better controlled today compared to prior visit. She denies any headache, dizziness, chest pain, dyspnea or palpitations.  She continues to have low back pain, which is chronic and worse with prolonged standing and walking. She recently retired and states that her pain is slightly improving. She denies any numbness or tingling of the LE.  Past Medical History:  Diagnosis Date   Aneurysm ([place])    Hypercholesteremia     Past Surgical History:  Procedure Laterality Date   CEREBRAL ANEURYSM REPAIR      History reviewed. No pertinent family history.  Social History   Socioeconomic History   Marital status: Married    Spouse name: Not on file   Number of children: Not on file   Years of education: Not on file   Highest education level: Not on file  Occupational History   Not on file  Tobacco Use   Smoking status: Never   Smokeless tobacco: Never  Substance and Sexual Activity   Alcohol use: No   Drug use: No   Sexual activity: Yes    Birth control/protection: Other-see comments  Other Topics Concern   Not on file  Social History Narrative   Not on file   Social Determinants of Health   Financial Resource Strain: Not on file  Food Insecurity: Not on file  Transportation Needs: Not on file  Physical Activity: Not on file  Stress: Not on file  Social Connections: Not on file  Intimate Partner Violence: Not on file    Outpatient Medications Prior to Visit  Medication Sig Dispense Refill   cyclobenzaprine (FLEXERIL) 5 MG tablet Take 1 tablet (5 mg total) by mouth 3 (three) times daily as needed for muscle spasms. 30 tablet 1   simvastatin (ZOCOR) 20 MG tablet Take 1 tablet (20 mg total) by mouth at bedtime. 90  tablet 3   terbinafine (LAMISIL AT) 1 % cream Apply 1 application topically 2 (two) times daily. 30 g 0   naproxen (NAPROSYN) 500 MG tablet Take 1 tablet (500 mg total) by mouth 2 (two) times daily with a meal. 30 tablet 0   No facility-administered medications prior to visit.    Allergies  Allergen Reactions   Naproxen Diarrhea   Niacin And Related Other (See Comments)    Redness, burning sensation    Review of Systems  Constitutional:  Negative for chills and fever.  HENT:  Negative for congestion, sinus pressure, sinus pain and sore throat.   Eyes:  Negative for pain and discharge.  Respiratory:  Negative for cough and shortness of breath.   Cardiovascular:  Negative for chest pain and palpitations.  Gastrointestinal:  Negative for abdominal pain, constipation, diarrhea, nausea and vomiting.  Endocrine: Negative for polydipsia and polyuria.  Genitourinary:  Negative for dysuria, flank pain and hematuria.  Musculoskeletal:  Positive for back pain (Chronic). Negative for neck pain and neck stiffness.  Skin:  Negative for rash.       Nail fungus  Neurological:  Negative for dizziness and weakness.  Psychiatric/Behavioral:  Negative for agitation and behavioral problems.       Objective:    Physical Exam Vitals reviewed.  Constitutional:  General: She is not in acute distress.    Appearance: She is not diaphoretic.  HENT:     Head: Normocephalic and atraumatic.     Nose: Nose normal. No congestion.     Mouth/Throat:     Mouth: Mucous membranes are moist.     Pharynx: No posterior oropharyngeal erythema.  Eyes:     General: No scleral icterus.    Extraocular Movements: Extraocular movements intact.  Cardiovascular:     Rate and Rhythm: Normal rate and regular rhythm.     Pulses: Normal pulses.     Heart sounds: Normal heart sounds. No murmur heard. Pulmonary:     Breath sounds: Normal breath sounds. No wheezing or rales.  Musculoskeletal:     Cervical back: Neck  supple. No tenderness.     Lumbar back: No swelling or bony tenderness.     Right lower leg: No edema.     Left lower leg: No edema.  Feet:     Right foot:     Toenail Condition: Fungal disease present.    Left foot:     Toenail Condition: Fungal disease present. Skin:    General: Skin is warm.     Findings: No rash.  Neurological:     General: No focal deficit present.     Mental Status: She is alert and oriented to person, place, and time.     Cranial Nerves: No cranial nerve deficit.     Sensory: No sensory deficit.     Motor: No weakness.  Psychiatric:        Mood and Affect: Mood normal.        Behavior: Behavior normal.    BP 140/78 (BP Location: Right Arm)    Pulse 80    Temp 98 F (36.7 C) (Oral)    Ht 5' 6.93" (1.7 m)    Wt 166 lb 1.3 oz (75.3 kg)    SpO2 94%    BMI 26.07 kg/m  Wt Readings from Last 3 Encounters:  05/29/21 166 lb 1.3 oz (75.3 kg)  04/10/21 163 lb (73.9 kg)  08/08/20 164 lb 6.4 oz (74.6 kg)        Assessment & Plan:   Problem List Items Addressed This Visit       Cardiovascular and Mediastinum   Essential hypertension - Primary    BP wnl today Continue low salt diet and activity as tolerated        Other   Low back pain    Chronic, no injury recently Could be DDD of lumbar spine Avoid heavy lifting and frequent bending Flexeril PRN Check X-ray lumbar spine      Relevant Orders   DG Lumbar Spine Complete (Completed)   Other Visit Diagnoses     Post-menopausal       Relevant Orders   DG Bone Density        No orders of the defined types were placed in this encounter.    Lindell Spar, MD

## 2021-05-29 NOTE — Assessment & Plan Note (Signed)
BP wnl today Continue low salt diet and activity as tolerated

## 2021-06-09 ENCOUNTER — Other Ambulatory Visit: Payer: Self-pay

## 2021-06-09 ENCOUNTER — Ambulatory Visit (HOSPITAL_COMMUNITY)
Admission: RE | Admit: 2021-06-09 | Discharge: 2021-06-09 | Disposition: A | Discharge status: Home / Self Care | Payer: Medicare PPO | Source: Ambulatory Visit | Attending: Internal Medicine | Admitting: Internal Medicine

## 2021-06-09 DIAGNOSIS — Z780 Asymptomatic menopausal state: Secondary | ICD-10-CM | POA: Insufficient documentation

## 2021-06-26 ENCOUNTER — Ambulatory Visit (INDEPENDENT_AMBULATORY_CARE_PROVIDER_SITE_OTHER): Payer: Medicare PPO

## 2021-06-26 ENCOUNTER — Other Ambulatory Visit: Payer: Self-pay

## 2021-06-26 DIAGNOSIS — Z0000 Encounter for general adult medical examination without abnormal findings: Secondary | ICD-10-CM | POA: Diagnosis not present

## 2021-06-26 NOTE — Progress Notes (Signed)
Subjective:   Teresa Simon is a 86 y.o. female who presents for Medicare Annual (Subsequent) preventive examination. I connected with  Teresa Simon on 06/26/21 by a audio enabled telemedicine application and verified that I am speaking with the correct person using two identifiers.  Patient Location: Home  Provider Location: Office/Clinic  I discussed the limitations of evaluation and management by telemedicine. The patient expressed understanding and agreed to proceed.  Review of Systems    Defer to PCP Cardiac Risk Factors include: advanced age (>41men, >58 women);hypertension     Objective:    Today's Vitals   06/26/21 0857  PainSc: 0-No pain   There is no height or weight on file to calculate BMI.  Advanced Directives 06/26/2021 07/21/2015 07/21/2015  Does Patient Have a Medical Advance Directive? No No No  Would patient like information on creating a medical advance directive? No - Patient declined No - patient declined information -    Current Medications (verified) Outpatient Encounter Medications as of 06/26/2021  Medication Sig   simvastatin (ZOCOR) 20 MG tablet Take 1 tablet (20 mg total) by mouth at bedtime.   terbinafine (LAMISIL AT) 1 % cream Apply 1 application topically 2 (two) times daily.   [DISCONTINUED] cyclobenzaprine (FLEXERIL) 5 MG tablet Take 1 tablet (5 mg total) by mouth 3 (three) times daily as needed for muscle spasms. (Patient not taking: Reported on 06/26/2021)   No facility-administered encounter medications on file as of 06/26/2021.    Allergies (verified) Naproxen and Niacin and related   History: Past Medical History:  Diagnosis Date   Aneurysm ([place])    Hypercholesteremia    Past Surgical History:  Procedure Laterality Date   CEREBRAL ANEURYSM REPAIR     Family History  Problem Relation Age of Onset   Obesity Sister    Cancer Sister        breast   Kidney disease Sister        dialysis   COPD Sister    Stroke Brother     Diabetes Brother    Heart disease Brother    Thyroid disease Daughter    Arthritis Daughter    Social History   Socioeconomic History   Marital status: Married    Spouse name: Jeneen Rinks   Number of children: 2   Years of education: 12   Highest education level: High school graduate  Occupational History   Not on file  Tobacco Use   Smoking status: Never   Smokeless tobacco: Never  Vaping Use   Vaping Use: Never used  Substance and Sexual Activity   Alcohol use: No   Drug use: No   Sexual activity: Yes    Birth control/protection: Other-see comments  Other Topics Concern   Not on file  Social History Narrative   Not on file   Social Determinants of Health   Financial Resource Strain: Low Risk    Difficulty of Paying Living Expenses: Not hard at all  Food Insecurity: No Food Insecurity   Worried About Charity fundraiser in the Last Year: Never true   Show Low in the Last Year: Never true  Transportation Needs: No Transportation Needs   Lack of Transportation (Medical): No   Lack of Transportation (Non-Medical): No  Physical Activity: Insufficiently Active   Days of Exercise per Week: 6 days   Minutes of Exercise per Session: 20 min  Stress: No Stress Concern Present   Feeling of Stress : Not at all  Social Connections:  Socially Isolated   Frequency of Communication with Friends and Family: Once a week   Frequency of Social Gatherings with Friends and Family: Never   Attends Religious Services: Never   Printmaker: No   Attends Music therapist: Never   Marital Status: Married    Tobacco Counseling Counseling given: Not Answered   Clinical Intake:  Pre-visit preparation completed: No  Pain : No/denies pain Pain Score: 0-No pain     Nutritional Risks: None Diabetes: No  What is the last grade level you completed in school?: 12  Diabetic?no  Interpreter Needed?: No  Information entered by ::  [place] of Daily Living In your present state of health, do you have any difficulty performing the following activities: 06/26/2021 06/27/2020  Hearing? N N  Vision? N N  Difficulty concentrating or making decisions? N N  Walking or climbing stairs? N N  Dressing or bathing? N N  Doing errands, shopping? N N  Preparing Food and eating ? N -  Using the Toilet? N -  In the past six months, have you accidently leaked urine? N -  Do you have problems with loss of bowel control? N -  Managing your Medications? N -  Managing your Finances? N -  Housekeeping or managing your Housekeeping? N -  Some recent data might be hidden    Patient Care Team: Lindell Spar, MD as PCP - General (Internal Medicine)  Indicate any recent Medical Services you may have received from other than [facility] providers in the past year (date may be approximate).     Assessment:   This is a routine wellness examination for Teresa Simon.  Hearing/Vision screen No results found.  Dietary issues and exercise activities discussed: Current Exercise Habits: Home exercise routine, Type of exercise: Other - see comments (stationary bike), Time (Minutes): 20, Frequency (Times/Week): 3, Weekly Exercise (Minutes/Week): 60, Intensity: Mild, Exercise limited by: cardiac condition(s)   Goals Addressed   None   Depression Screen PHQ 2/9 Scores 06/26/2021 05/29/2021 04/10/2021 08/08/2020 06/27/2020  PHQ - 2 Score 0 0 0 0 0    Fall Risk Fall Risk  06/26/2021 05/29/2021 04/10/2021 08/08/2020 06/27/2020  Falls in the past year? 0 0 0 1 0  Number falls in past yr: 0 0 0 0 0  Injury with Fall? 0 0 0 0 0  Risk for fall due to : No Fall Risks No Fall Risks No Fall Risks History of fall(s) No Fall Risks  Follow up Falls evaluation completed Falls evaluation completed Falls evaluation completed Falls evaluation completed;Education provided;Falls prevention discussed;Follow up appointment Falls evaluation completed    FALL RISK  PREVENTION PERTAINING TO THE HOME:  Any stairs in or around the home? Yes  If so, are there any without handrails? Yes  Home free of loose throw rugs in walkways, pet beds, electrical cords, etc? Yes  Adequate lighting in your home to reduce risk of falls? Yes   ASSISTIVE DEVICES UTILIZED TO PREVENT FALLS:  Life alert? Yes  Use of a cane, walker or w/c? No  Grab bars in the bathroom? Yes  Shower chair or bench in shower? Yes  Elevated toilet seat or a handicapped toilet? Yes    Cognitive Function:     6CIT Screen 06/26/2021  What Year? 0 points  What month? 0 points  What time? 0 points  Count back from 20 0 points  Months in reverse 0 points  Repeat phrase 2  points  Total Score 2    Immunizations Immunization History  Administered Date(s) Administered   Influenza-Unspecified 03/23/2020, 03/12/2021   Moderna Covid-19 Vaccine Bivalent Booster 18yrs & up 03/08/2021   Moderna Sars-Covid-2 Vaccination 07/24/2019, 08/21/2019, 03/28/2020   Tdap 03/02/2013    TDAP status: Up to date  Flu Vaccine status: Up to date  Pneumococcal vaccine status: Due, Education has been provided regarding the importance of this vaccine. Advised may receive this vaccine at local pharmacy or Health Dept. Aware to provide a copy of the vaccination record if obtained from local pharmacy or Health Dept. Verbalized acceptance and understanding.  Covid-19 vaccine status: Information provided on how to obtain vaccines.   Qualifies for Shingles Vaccine? Yes   Zostavax completed No   Shingrix Completed?: No.    Education has been provided regarding the importance of this vaccine. Patient has been advised to call insurance company to determine out of pocket expense if they have not yet received this vaccine. Advised may also receive vaccine at local pharmacy or Health Dept. Verbalized acceptance and understanding.  Screening Tests Health Maintenance  Topic Date Due   Pneumonia Vaccine 67+ Years old (1 -  PCV) Never done   Zoster Vaccines- Shingrix (1 of 2) Never done   TETANUS/TDAP  03/03/2023   INFLUENZA VACCINE  Completed   DEXA SCAN  Completed   COVID-19 Vaccine  Completed   HPV VACCINES  Aged Out    Health Maintenance  Health Maintenance Due  Topic Date Due   Pneumonia Vaccine 58+ Years old (1 - PCV) Never done   Zoster Vaccines- Shingrix (1 of 2) Never done    Colorectal cancer screening: No longer required.   Mammogram status: No longer required due to age.  Bone Density status: Completed 06/09/2021. Results reflect: Bone density results: NORMAL. Repeat every 2 years.  Lung Cancer Screening: (Low Dose CT Chest recommended if Age 20-80 years, 30 pack-year currently smoking OR have quit w/in 15years.) does not qualify.   Lung Cancer Screening Referral: n/a  Additional Screening:  Hepatitis C Screening: does not qualify; Completed not high risk  Vision Screening: Recommended annual ophthalmology exams for early detection of glaucoma and other disorders of the eye. Is the patient up to date with their annual eye exam?  No  Who is the provider or what is the name of the office in which the patient attends annual eye exams? Does not have an eye dr If pt is not established with a provider, would they like to be referred to a provider to establish care? No .   Dental Screening: Recommended annual dental exams for proper oral hygiene  Community Resource Referral / Chronic Care Management: CRR required this visit?  No   CCM required this visit?  No      Plan:     I have personally reviewed and noted the following in the patients chart:   Medical and social history Use of alcohol, tobacco or illicit drugs  Current medications and supplements including opioid prescriptions.  Functional ability and status Nutritional status Physical activity Advanced directives List of other physicians Hospitalizations, surgeries, and ER visits in previous 12  months Vitals Screenings to include cognitive, depression, and falls Referrals and appointments  In addition, I have reviewed and discussed with patient certain preventive protocols, quality metrics, and best practice recommendations. A written personalized care plan for preventive services as well as general preventive health recommendations were provided to patient.     Earline Mayotte, CMA  06/26/2021   Nurse Notes:  Ms. Flinchbaugh , Thank you for taking time to come for your Medicare Wellness Visit. I appreciate your ongoing commitment to your health goals. Please review the following plan we discussed and let me know if I can assist you in the future.   These are the goals we discussed:  Goals   None     This is a list of the screening recommended for you and due dates:  Health Maintenance  Topic Date Due   Pneumonia Vaccine (1 - PCV) Never done   Zoster (Shingles) Vaccine (1 of 2) Never done   Tetanus Vaccine  03/03/2023   Flu Shot  Completed   DEXA scan (bone density measurement)  Completed   COVID-19 Vaccine  Completed   HPV Vaccine  Aged Out

## 2021-06-26 NOTE — Patient Instructions (Signed)

## 2021-07-03 DIAGNOSIS — Z719 Counseling, unspecified: Secondary | ICD-10-CM | POA: Diagnosis not present

## 2021-07-03 DIAGNOSIS — Z872 Personal history of diseases of the skin and subcutaneous tissue: Secondary | ICD-10-CM | POA: Diagnosis not present

## 2021-07-03 DIAGNOSIS — Z85828 Personal history of other malignant neoplasm of skin: Secondary | ICD-10-CM | POA: Diagnosis not present

## 2021-07-03 DIAGNOSIS — Z1283 Encounter for screening for malignant neoplasm of skin: Secondary | ICD-10-CM | POA: Diagnosis not present

## 2021-07-03 DIAGNOSIS — D2339 Other benign neoplasm of skin of other parts of face: Secondary | ICD-10-CM | POA: Diagnosis not present

## 2021-07-03 DIAGNOSIS — D23121 Other benign neoplasm of skin of left upper eyelid, including canthus: Secondary | ICD-10-CM | POA: Diagnosis not present

## 2021-07-03 DIAGNOSIS — D230 Other benign neoplasm of skin of lip: Secondary | ICD-10-CM | POA: Diagnosis not present

## 2021-07-03 DIAGNOSIS — D234 Other benign neoplasm of skin of scalp and neck: Secondary | ICD-10-CM | POA: Diagnosis not present

## 2021-07-03 DIAGNOSIS — D23111 Other benign neoplasm of skin of right upper eyelid, including canthus: Secondary | ICD-10-CM | POA: Diagnosis not present

## 2021-08-15 ENCOUNTER — Other Ambulatory Visit: Payer: Self-pay

## 2021-08-15 ENCOUNTER — Telehealth: Payer: Self-pay

## 2021-08-15 DIAGNOSIS — E785 Hyperlipidemia, unspecified: Secondary | ICD-10-CM

## 2021-08-15 MED ORDER — SIMVASTATIN 20 MG PO TABS: 20.0000 mg | ORAL_TABLET | Freq: Every day | ORAL | 3 refills | Status: DC

## 2021-08-15 NOTE — Telephone Encounter (Signed)
Refill sent.

## 2021-08-15 NOTE — Telephone Encounter (Signed)
Patient called [facility] in [place] has not yet received prescription on  ?simvastatin (ZOCOR) 20 MG tablet. ? ?Pharmacy: [place] ?

## 2021-09-29 ENCOUNTER — Encounter: Payer: Self-pay | Admitting: Internal Medicine

## 2021-09-29 ENCOUNTER — Ambulatory Visit (INDEPENDENT_AMBULATORY_CARE_PROVIDER_SITE_OTHER): Payer: Medicare PPO | Admitting: Internal Medicine

## 2021-09-29 VITALS — BP 118/80 | HR 83 | Resp 18 | Ht 66.0 in | Wt 175.6 lb

## 2021-09-29 DIAGNOSIS — E782 Mixed hyperlipidemia: Secondary | ICD-10-CM

## 2021-09-29 DIAGNOSIS — G459 Transient cerebral ischemic attack, unspecified: Secondary | ICD-10-CM | POA: Diagnosis not present

## 2021-09-29 DIAGNOSIS — R1319 Other dysphagia: Secondary | ICD-10-CM | POA: Diagnosis not present

## 2021-09-29 NOTE — Patient Instructions (Signed)
Please get Esophagram done at [facility]. ? ?Please avoid large boluses at a time to avoid swallowing difficulty. ?

## 2021-10-02 NOTE — Progress Notes (Signed)
? ?Established Patient Office Visit ? ?Subjective:  ?Patient ID: Teresa Simon, female    DOB: May 28, 1935  Age: 86 y.o. MRN: [id number] ? ?CC:  ?Chief Complaint  ?Patient presents with  ? Follow-up  ?  4 month follow up HTN and HLD pt is getting strangled on food   ? ? ?HPI ?Teresa Simon is a 86 y.o. female with past medical history of cerebral aneurysm s/p clip, TIA and chronic back pain who presents for f/u of her chronic medical conditions. ? ?Her chronic low back pain has resolved now as she has stopped working.  She has chronic hip and knee pain, for which she uses topical agents. ? ?She takes Lipitor for HLD. ? ?She complains of intermittent dysphagia, especially to solid food.  She has to chew very hard, especially meat products to avoid dysphagia.  She denies any dysphagia to liquids.  Denies any odynophagia, heartburn or abdominal pain currently.  She denies GI referral currently. ? ?Past Medical History:  ?Diagnosis Date  ? Aneurysm ([place])   ? Hypercholesteremia   ? ? ?Past Surgical History:  ?Procedure Laterality Date  ? CEREBRAL ANEURYSM REPAIR    ? ? ?Family History  ?Problem Relation Age of Onset  ? Obesity Sister   ? Cancer Sister   ?     breast  ? Kidney disease Sister   ?     dialysis  ? COPD Sister   ? Stroke Brother   ? Diabetes Brother   ? Heart disease Brother   ? Thyroid disease Daughter   ? Arthritis Daughter   ? ? ?Social History  ? ?Socioeconomic History  ? Marital status: Married  ?  Spouse name: Jeneen Rinks  ? Number of children: 2  ? Years of education: 14  ? Highest education level: High school graduate  ?Occupational History  ? Not on file  ?Tobacco Use  ? Smoking status: Never  ? Smokeless tobacco: Never  ?Vaping Use  ? Vaping Use: Never used  ?Substance and Sexual Activity  ? Alcohol use: No  ? Drug use: No  ? Sexual activity: Yes  ?  Birth control/protection: Other-see comments  ?Other Topics Concern  ? Not on file  ?Social History Narrative  ? Not on file  ? ?Social Determinants of  Health  ? ?Financial Resource Strain: Low Risk   ? Difficulty of Paying Living Expenses: Not hard at all  ?Food Insecurity: No Food Insecurity  ? Worried About Charity fundraiser in the Last Year: Never true  ? Ran Out of Food in the Last Year: Never true  ?Transportation Needs: No Transportation Needs  ? Lack of Transportation (Medical): No  ? Lack of Transportation (Non-Medical): No  ?Physical Activity: Insufficiently Active  ? Days of Exercise per Week: 6 days  ? Minutes of Exercise per Session: 20 min  ?Stress: No Stress Concern Present  ? Feeling of Stress : Not at all  ?Social Connections: Socially Isolated  ? Frequency of Communication with Friends and Family: Once a week  ? Frequency of Social Gatherings with Friends and Family: Never  ? Attends Religious Services: Never  ? Active Member of Clubs or Organizations: No  ? Attends Archivist Meetings: Never  ? Marital Status: Married  ?Intimate Partner Violence: Not At Risk  ? Fear of Current or Ex-Partner: No  ? Emotionally Abused: No  ? Physically Abused: No  ? Sexually Abused: No  ? ? ?Outpatient Medications Prior to Visit  ?Medication Sig  Dispense Refill  ? simvastatin (ZOCOR) 20 MG tablet Take 1 tablet (20 mg total) by mouth at bedtime. 90 tablet 3  ? terbinafine (LAMISIL AT) 1 % cream Apply 1 application topically 2 (two) times daily. 30 g 0  ? ?No facility-administered medications prior to visit.  ? ? ?Allergies  ?Allergen Reactions  ? Naproxen Diarrhea  ? Niacin And Related Other (See Comments)  ?  Redness, burning sensation  ? ? ?ROS ?Review of Systems  ?Constitutional:  Negative for chills and fever.  ?HENT:  Negative for congestion, sinus pressure, sinus pain and sore throat.   ?Eyes:  Negative for pain and discharge.  ?Respiratory:  Negative for cough and shortness of breath.   ?Cardiovascular:  Negative for chest pain and palpitations.  ?Gastrointestinal:  Negative for abdominal pain, diarrhea, nausea and vomiting.  ?Endocrine: Negative  for polydipsia and polyuria.  ?Genitourinary:  Negative for dysuria, flank pain and hematuria.  ?Musculoskeletal:  Positive for back pain (Chronic). Negative for neck pain and neck stiffness.  ?Skin:  Negative for rash.  ?Neurological:  Negative for dizziness and weakness.  ?Psychiatric/Behavioral:  Negative for agitation and behavioral problems.   ? ?  ?Objective:  ?  ?Physical Exam ?Vitals reviewed.  ?Constitutional:   ?   General: She is not in acute distress. ?   Appearance: She is not diaphoretic.  ?HENT:  ?   Head: Normocephalic and atraumatic.  ?   Nose: Nose normal. No congestion.  ?   Mouth/Throat:  ?   Mouth: Mucous membranes are moist.  ?   Pharynx: No posterior oropharyngeal erythema.  ?Eyes:  ?   General: No scleral icterus. ?   Extraocular Movements: Extraocular movements intact.  ?Cardiovascular:  ?   Rate and Rhythm: Normal rate and regular rhythm.  ?   Pulses: Normal pulses.  ?   Heart sounds: Normal heart sounds. No murmur heard. ?Pulmonary:  ?   Breath sounds: Normal breath sounds. No wheezing or rales.  ?Abdominal:  ?   Palpations: Abdomen is soft.  ?   Tenderness: There is no abdominal tenderness.  ?Musculoskeletal:  ?   Cervical back: Neck supple. No tenderness.  ?   Lumbar back: No swelling or bony tenderness.  ?   Right lower leg: No edema.  ?   Left lower leg: No edema.  ?Skin: ?   General: Skin is warm.  ?   Findings: No rash.  ?Neurological:  ?   General: No focal deficit present.  ?   Mental Status: She is alert and oriented to person, place, and time.  ?   Sensory: No sensory deficit.  ?   Motor: No weakness.  ?Psychiatric:     ?   Mood and Affect: Mood normal.     ?   Behavior: Behavior normal.  ? ? ?BP 118/80 (BP Location: Left Arm, Patient Position: Sitting, Cuff Size: Normal)   Pulse 83   Resp 18   Ht _0  (1.676 m)   Wt 175 lb 9.6 oz (79.7 kg)   SpO2 96%   BMI 28.34 kg/m?  ?Wt Readings from Last 3 Encounters:  ?09/29/21 175 lb 9.6 oz (79.7 kg)  ?05/29/21 166 lb 1.3 oz (75.3  kg)  ?04/10/21 163 lb (73.9 kg)  ? ? ?Lab Results  ?Component Value Date  ? TSH 1.080 04/10/2021  ? ?Lab Results  ?Component Value Date  ? WBC 7.1 04/10/2021  ? HGB 14.1 04/10/2021  ? HCT 41.6 04/10/2021  ?  MCV 91 04/10/2021  ? PLT 225 04/10/2021  ? ?Lab Results  ?Component Value Date  ? NA 140 04/10/2021  ? K 5.0 04/10/2021  ? CO2 26 04/10/2021  ? GLUCOSE 93 04/10/2021  ? BUN 13 04/10/2021  ? CREATININE 0.88 04/10/2021  ? BILITOT 0.6 04/10/2021  ? ALKPHOS 46 04/10/2021  ? AST 35 04/10/2021  ? ALT 25 04/10/2021  ? PROT 7.3 04/10/2021  ? ALBUMIN 4.8 (H) 04/10/2021  ? CALCIUM 10.3 04/10/2021  ? ANIONGAP 9 03/16/2020  ? EGFR 66 04/10/2021  ? ?Lab Results  ?Component Value Date  ? CHOL 157 04/10/2021  ? ?Lab Results  ?Component Value Date  ? HDL 73 04/10/2021  ? ?Lab Results  ?Component Value Date  ? [place] 67 04/10/2021  ? ?Lab Results  ?Component Value Date  ? TRIG 94 04/10/2021  ? ?Lab Results  ?Component Value Date  ? CHOLHDL 2.2 04/10/2021  ? ?Lab Results  ?Component Value Date  ? HGBA1C 6.0 (H) 04/10/2021  ? ? ?  ?Assessment & Plan:  ? ?Problem List Items Addressed This Visit   ? ?  ? Cardiovascular and Mediastinum  ? TIA (transient ischemic attack)  ?  On Aspirin and statin ?No focal neurologic deficits ? ?  ?  ?  ? Digestive  ? Esophageal dysphagia  ?  Mostly to solids ?Check barium esophagram ?Denies GI referral for now ? ?  ?  ? Relevant Orders  ? DG ESOPHAGUS W SINGLE CM (SOL OR THIN BA)  ?  ? Other  ? Hyperlipidemia - Primary  ?  Well controlled, on Simvastatin currently ? ?  ?  ? ? ?No orders of the defined types were placed in this encounter. ? ? ?Follow-up: Return in about 6 months (around 04/01/2022) for Annual physical (after 11/17).  ? ? ?Lindell Spar, MD ?

## 2021-10-03 NOTE — Assessment & Plan Note (Signed)
On Aspirin and statin No focal neurologic deficits 

## 2021-10-03 NOTE — Assessment & Plan Note (Signed)
Mostly to solids ?Check barium esophagram ?Denies GI referral for now ?

## 2021-10-03 NOTE — Assessment & Plan Note (Addendum)
Well controlled, on Simvastatin currently 

## 2022-01-27 ENCOUNTER — Telehealth: Payer: Self-pay | Admitting: Internal Medicine

## 2022-01-27 ENCOUNTER — Other Ambulatory Visit: Payer: Self-pay | Admitting: *Deleted

## 2022-01-27 DIAGNOSIS — E785 Hyperlipidemia, unspecified: Secondary | ICD-10-CM

## 2022-01-27 DIAGNOSIS — B351 Tinea unguium: Secondary | ICD-10-CM

## 2022-01-27 MED ORDER — TERBINAFINE HCL 1 % EX CREA: 1.0000 | TOPICAL_CREAM | Freq: Two times a day (BID) | CUTANEOUS | 0 refills | Status: DC

## 2022-01-27 MED ORDER — SIMVASTATIN 20 MG PO TABS: 20.0000 mg | ORAL_TABLET | Freq: Every day | ORAL | 0 refills | Status: DC

## 2022-01-27 NOTE — Telephone Encounter (Signed)
Medication sent to pharmacy per [place] and [place] are almost back home

## 2022-01-27 NOTE — Telephone Encounter (Signed)
Teresa Simon called in on patient behalf Patient is out of town and has left medications at home .  Patient is requesting 1 week supply of meds to [facility], [place]   simvastatin (ZOCOR) 20 MG tablet [[id number]]   terbinafine (LAMISIL AT) 1 % cream

## 2022-02-02 ENCOUNTER — Other Ambulatory Visit: Payer: Self-pay

## 2022-02-02 DIAGNOSIS — E785 Hyperlipidemia, unspecified: Secondary | ICD-10-CM

## 2022-02-02 MED ORDER — SIMVASTATIN 20 MG PO TABS: 20.0000 mg | ORAL_TABLET | Freq: Every day | ORAL | 1 refills | Status: DC

## 2022-04-08 DIAGNOSIS — R351 Nocturia: Secondary | ICD-10-CM | POA: Diagnosis not present

## 2022-04-08 DIAGNOSIS — N3281 Overactive bladder: Secondary | ICD-10-CM | POA: Diagnosis not present

## 2022-04-08 DIAGNOSIS — R519 Headache, unspecified: Secondary | ICD-10-CM | POA: Diagnosis not present

## 2022-04-14 ENCOUNTER — Encounter: Payer: Medicare PPO | Admitting: Internal Medicine

## 2022-05-07 ENCOUNTER — Encounter: Payer: Self-pay | Admitting: Internal Medicine

## 2022-05-07 ENCOUNTER — Ambulatory Visit (INDEPENDENT_AMBULATORY_CARE_PROVIDER_SITE_OTHER): Payer: Medicare PPO | Admitting: Internal Medicine

## 2022-05-07 VITALS — BP 146/80 | HR 68 | Ht 66.0 in | Wt 173.0 lb

## 2022-05-07 DIAGNOSIS — Z8673 Personal history of transient ischemic attack (TIA), and cerebral infarction without residual deficits: Secondary | ICD-10-CM

## 2022-05-07 DIAGNOSIS — E559 Vitamin D deficiency, unspecified: Secondary | ICD-10-CM | POA: Diagnosis not present

## 2022-05-07 DIAGNOSIS — E782 Mixed hyperlipidemia: Secondary | ICD-10-CM

## 2022-05-07 DIAGNOSIS — I10 Essential (primary) hypertension: Secondary | ICD-10-CM

## 2022-05-07 DIAGNOSIS — Z0001 Encounter for general adult medical examination with abnormal findings: Secondary | ICD-10-CM | POA: Diagnosis not present

## 2022-05-07 DIAGNOSIS — R7303 Prediabetes: Secondary | ICD-10-CM

## 2022-05-07 MED ORDER — AMLODIPINE BESYLATE 5 MG PO TABS: 5.0000 mg | ORAL_TABLET | Freq: Every day | ORAL | 1 refills | Status: DC

## 2022-05-07 NOTE — Patient Instructions (Addendum)
Please start taking Amlodipine as prescribed.  Please continue to follow low salt diet and ambulate as tolerated.

## 2022-05-07 NOTE — Assessment & Plan Note (Signed)
On Aspirin and statin No focal neurologic deficits

## 2022-05-07 NOTE — Assessment & Plan Note (Signed)
Well controlled, on Simvastatin currently

## 2022-05-07 NOTE — Assessment & Plan Note (Signed)
Physical exam as documented. Fasting blood tests today. Has had flu and COVID booster at local pharmacy.

## 2022-05-07 NOTE — Assessment & Plan Note (Signed)
BP Readings from Last 1 Encounters:  05/07/22 (!) 146/80   Although would be acceptable at her age, but due to her history of cerebral aneurysm, started amlodipine 5 mg QD Would avoid diuretic medicine as she is already struggling with urgent incontinence and nocturia Counseled for compliance with the medications Advised DASH diet and moderate exercise/walking as tolerated

## 2022-05-07 NOTE — Progress Notes (Signed)
Established Patient Office Visit  Subjective:  Patient ID: Teresa Simon, female    DOB: September 08, 1935  Age: 86 y.o. MRN: [id number]  CC:  Chief Complaint  Patient presents with   Annual Exam    HPI Teresa Simon is a 86 y.o. female with past medical history of cerebral aneurysm s/p clip, TIA and chronic back pain who presents for annual physical.  Her BP was elevated today.  She has had intermittent high BP in the past.  She denies any headache, dizziness, chest pain, dyspnea or palpitations currently.  She has a episodes of headache.  She has history of cerebral aneurysm repair.  She takes Lipitor for HLD.  She was recently placed on oxybutynin for urge incontinence.  She denies any dysuria or hematuria currently.  Past Medical History:  Diagnosis Date   Aneurysm ([place])    Hypercholesteremia     Past Surgical History:  Procedure Laterality Date   CEREBRAL ANEURYSM REPAIR      Family History  Problem Relation Age of Onset   Obesity Sister    Cancer Sister        breast   Kidney disease Sister        dialysis   COPD Sister    Stroke Brother    Diabetes Brother    Heart disease Brother    Thyroid disease Daughter    Arthritis Daughter     Social History   Socioeconomic History   Marital status: Married    Spouse name: Jeneen Rinks   Number of children: 2   Years of education: 12   Highest education level: High school graduate  Occupational History   Not on file  Tobacco Use   Smoking status: Never   Smokeless tobacco: Never  Vaping Use   Vaping Use: Never used  Substance and Sexual Activity   Alcohol use: No   Drug use: No   Sexual activity: Yes    Birth control/protection: Other-see comments  Other Topics Concern   Not on file  Social History Narrative   Not on file   Social Determinants of Health   Financial Resource Strain: Low Risk  (06/26/2021)   Overall Financial Resource Strain (CARDIA)    Difficulty of Paying Living Expenses: Not hard at  all  Food Insecurity: No Food Insecurity (06/26/2021)   Hunger Vital Sign    Worried About Running Out of Food in the Last Year: Never true    [place] in the Last Year: Never true  Transportation Needs: No Transportation Needs (06/26/2021)   PRAPARE - Hydrologist (Medical): No    Lack of Transportation (Non-Medical): No  Physical Activity: Insufficiently Active (06/26/2021)   Exercise Vital Sign    Days of Exercise per Week: 6 days    Minutes of Exercise per Session: 20 min  Stress: No Stress Concern Present (06/26/2021)   [place]    Feeling of Stress : Not at all  Social Connections: Socially Isolated (06/26/2021)   Social Connection and Isolation Panel [NHANES]    Frequency of Communication with Friends and Family: Once a week    Frequency of Social Gatherings with Friends and Family: Never    Attends Religious Services: Never    Marine scientist or Organizations: No    Attends Archivist Meetings: Never    Marital Status: Married  Human resources officer Violence: Not At Risk (06/26/2021)   Humiliation, Afraid,  Rape, and Kick questionnaire    Fear of Current or Ex-Partner: No    Emotionally Abused: No    Physically Abused: No    Sexually Abused: No    Outpatient Medications Prior to Visit  Medication Sig Dispense Refill   oxybutynin (DITROPAN XL) 15 MG 24 hr tablet Take 15 mg by mouth daily.     simvastatin (ZOCOR) 20 MG tablet Take 1 tablet (20 mg total) by mouth at bedtime. 90 tablet 1   terbinafine (LAMISIL AT) 1 % cream Apply 1 Application topically 2 (two) times daily. 30 g 0   No facility-administered medications prior to visit.    Allergies  Allergen Reactions   Naproxen Diarrhea   Niacin And Related Other (See Comments)    Redness, burning sensation    ROS Review of Systems  Constitutional:  Negative for chills and fever.  HENT:  Negative for congestion,  sinus pressure, sinus pain and sore throat.   Eyes:  Negative for pain and discharge.  Respiratory:  Negative for cough and shortness of breath.   Cardiovascular:  Negative for chest pain and palpitations.  Gastrointestinal:  Negative for abdominal pain, diarrhea, nausea and vomiting.  Endocrine: Negative for polydipsia and polyuria.  Genitourinary:  Negative for dysuria, flank pain and hematuria.  Musculoskeletal:  Positive for back pain (Chronic). Negative for neck pain and neck stiffness.  Skin:  Negative for rash.  Neurological:  Negative for dizziness and weakness.  Psychiatric/Behavioral:  Negative for agitation and behavioral problems.       Objective:    Physical Exam Vitals reviewed.  Constitutional:      General: She is not in acute distress.    Appearance: She is not diaphoretic.  HENT:     Head: Normocephalic and atraumatic.     Nose: Nose normal. No congestion.     Mouth/Throat:     Mouth: Mucous membranes are moist.     Pharynx: No posterior oropharyngeal erythema.  Eyes:     General: No scleral icterus.    Extraocular Movements: Extraocular movements intact.  Cardiovascular:     Rate and Rhythm: Normal rate and regular rhythm.     Pulses: Normal pulses.     Heart sounds: Normal heart sounds. No murmur heard. Pulmonary:     Breath sounds: Normal breath sounds. No wheezing or rales.  Abdominal:     Palpations: Abdomen is soft.     Tenderness: There is no abdominal tenderness.  Musculoskeletal:     Cervical back: Neck supple. No tenderness.     Lumbar back: No swelling or bony tenderness.     Right lower leg: No edema.     Left lower leg: No edema.  Skin:    General: Skin is warm.     Findings: No rash.  Neurological:     General: No focal deficit present.     Mental Status: She is alert and oriented to person, place, and time.     Sensory: No sensory deficit.     Motor: No weakness.  Psychiatric:        Mood and Affect: Mood normal.        Behavior:  Behavior normal.     BP (!) 146/80 (BP Location: Left Arm, Patient Position: Sitting, Cuff Size: Normal)   Pulse 68   Ht _0  (1.676 m)   Wt 173 lb (78.5 kg)   SpO2 95%   BMI 27.92 kg/m  Wt Readings from Last 3 Encounters:  05/07/22 173 lb (  78.5 kg)  09/29/21 175 lb 9.6 oz (79.7 kg)  05/29/21 166 lb 1.3 oz (75.3 kg)    Lab Results  Component Value Date   TSH 1.080 04/10/2021   Lab Results  Component Value Date   WBC 7.1 04/10/2021   HGB 14.1 04/10/2021   HCT 41.6 04/10/2021   MCV 91 04/10/2021   PLT 225 04/10/2021   Lab Results  Component Value Date   NA 140 04/10/2021   K 5.0 04/10/2021   CO2 26 04/10/2021   GLUCOSE 93 04/10/2021   BUN 13 04/10/2021   CREATININE 0.88 04/10/2021   BILITOT 0.6 04/10/2021   ALKPHOS 46 04/10/2021   AST 35 04/10/2021   ALT 25 04/10/2021   PROT 7.3 04/10/2021   ALBUMIN 4.8 (H) 04/10/2021   CALCIUM 10.3 04/10/2021   ANIONGAP 9 03/16/2020   EGFR 66 04/10/2021   Lab Results  Component Value Date   CHOL 157 04/10/2021   Lab Results  Component Value Date   HDL 73 04/10/2021   Lab Results  Component Value Date   LDLCALC 67 04/10/2021   Lab Results  Component Value Date   TRIG 94 04/10/2021   Lab Results  Component Value Date   CHOLHDL 2.2 04/10/2021   Lab Results  Component Value Date   HGBA1C 6.0 (H) 04/10/2021      Assessment & Plan:   Problem List Items Addressed This Visit       Cardiovascular and Mediastinum   Essential hypertension    BP Readings from Last 1 Encounters:  05/07/22 (!) 146/80  Although would be acceptable at her age, but due to her history of cerebral aneurysm, started amlodipine 5 mg QD Would avoid diuretic medicine as she is already struggling with urgent incontinence and nocturia Counseled for compliance with the medications Advised DASH diet and moderate exercise/walking as tolerated       Relevant Medications   amLODipine (NORVASC) 5 MG tablet   Other Relevant Orders   TSH    CMP14+EGFR   CBC with Differential/Platelet     Other   History of TIA (transient ischemic attack)    On Aspirin and statin No focal neurologic deficits      Relevant Orders   TSH   Lipid panel   Hyperlipidemia    Well controlled, on Simvastatin currently      Relevant Medications   amLODipine (NORVASC) 5 MG tablet   Other Relevant Orders   Lipid panel   Encounter for general adult medical examination with abnormal findings - Primary    Physical exam as documented. Fasting blood tests today. Has had flu and COVID booster at local pharmacy.      Other Visit Diagnoses     Vitamin D deficiency       Relevant Orders   VITAMIN D 25 Hydroxy (Vit-D Deficiency, Fractures)   Prediabetes       Relevant Orders   Hemoglobin A1c       Meds ordered this encounter  Medications   amLODipine (NORVASC) 5 MG tablet    Sig: Take 1 tablet (5 mg total) by mouth daily.    Dispense:  30 tablet    Refill:  1    Follow-up: Return in about 6 weeks (around 06/18/2022) for HLD and HTN.    Lindell Spar, MD

## 2022-05-08 LAB — CBC WITH DIFFERENTIAL/PLATELET
Basophils Absolute: 0 10*3/uL (ref 0.0–0.2)
Basos: 1 %
EOS (ABSOLUTE): 0.1 10*3/uL (ref 0.0–0.4)
Eos: 2 %
Hematocrit: 41.8 % (ref 34.0–46.6)
Hemoglobin: 14.3 g/dL (ref 11.1–15.9)
Immature Grans (Abs): 0 10*3/uL (ref 0.0–0.1)
Immature Granulocytes: 0 %
Lymphocytes Absolute: 2.1 10*3/uL (ref 0.7–3.1)
Lymphs: 28 %
MCH: 31.6 pg (ref 26.6–33.0)
MCHC: 34.2 g/dL (ref 31.5–35.7)
MCV: 93 fL (ref 79–97)
Monocytes Absolute: 0.7 10*3/uL (ref 0.1–0.9)
Monocytes: 9 %
Neutrophils Absolute: 4.7 10*3/uL (ref 1.4–7.0)
Neutrophils: 60 %
Platelets: 231 10*3/uL (ref 150–450)
RBC: 4.52 x10E6/uL (ref 3.77–5.28)
RDW: 11.9 % (ref 11.7–15.4)
WBC: 7.7 10*3/uL (ref 3.4–10.8)

## 2022-05-08 LAB — CMP14+EGFR
ALT: 16 IU/L (ref 0–32)
AST: 21 IU/L (ref 0–40)
Albumin/Globulin Ratio: 2 (ref 1.2–2.2)
Albumin: 4.7 g/dL (ref 3.7–4.7)
Alkaline Phosphatase: 44 IU/L (ref 44–121)
BUN/Creatinine Ratio: 12 (ref 12–28)
BUN: 11 mg/dL (ref 8–27)
Bilirubin Total: 0.6 mg/dL (ref 0.0–1.2)
CO2: 26 mmol/L (ref 20–29)
Calcium: 10.2 mg/dL (ref 8.7–10.3)
Chloride: 105 mmol/L (ref 96–106)
Creatinine, Ser: 0.93 mg/dL (ref 0.57–1.00)
Globulin, Total: 2.3 g/dL (ref 1.5–4.5)
Glucose: 103 mg/dL — ABNORMAL HIGH (ref 70–99)
Potassium: 5.3 mmol/L — ABNORMAL HIGH (ref 3.5–5.2)
Sodium: 145 mmol/L — ABNORMAL HIGH (ref 134–144)
Total Protein: 7 g/dL (ref 6.0–8.5)
eGFR: 61 mL/min/{1.73_m2} (ref >59)

## 2022-05-08 LAB — LIPID PANEL
Chol/HDL Ratio: 2.2 ratio (ref 0.0–4.4)
Cholesterol, Total: 153 mg/dL (ref 100–199)
HDL: 69 mg/dL (ref >39)
LDL Chol Calc (NIH): 63 mg/dL (ref 0–99)
Triglycerides: 118 mg/dL (ref 0–149)
VLDL Cholesterol Cal: 21 mg/dL (ref 5–40)

## 2022-05-08 LAB — VITAMIN D 25 HYDROXY (VIT D DEFICIENCY, FRACTURES): Vit D, 25-Hydroxy: 30.9 ng/mL (ref 30.0–100.0)

## 2022-05-08 LAB — HEMOGLOBIN A1C
Est. average glucose Bld gHb Est-mCnc: 126 mg/dL
Hgb A1c MFr Bld: 6 % — ABNORMAL HIGH (ref 4.8–5.6)

## 2022-05-08 LAB — TSH: TSH: 1.25 u[IU]/mL (ref 0.450–4.500)

## 2022-05-21 DIAGNOSIS — Z1231 Encounter for screening mammogram for malignant neoplasm of breast: Secondary | ICD-10-CM | POA: Diagnosis not present

## 2022-05-30 IMAGING — DX DG LUMBAR SPINE COMPLETE 4+V
5 series · 5 of 5 positions shown · non-contrast
Comparison: None.

CLINICAL DATA: Low back pain

EXAM:
LUMBAR SPINE - COMPLETE 4+ VIEW

[l-spine ap]
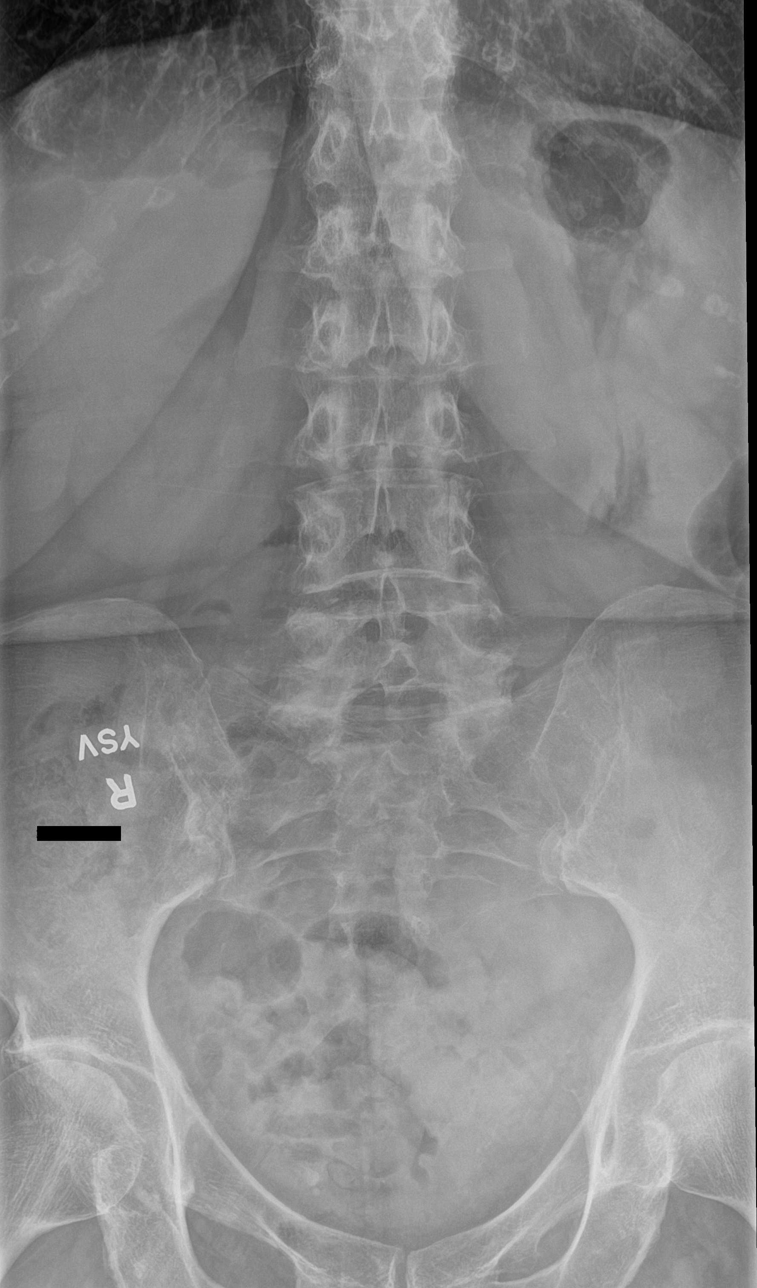

[l-spine obl (1 of 2)]
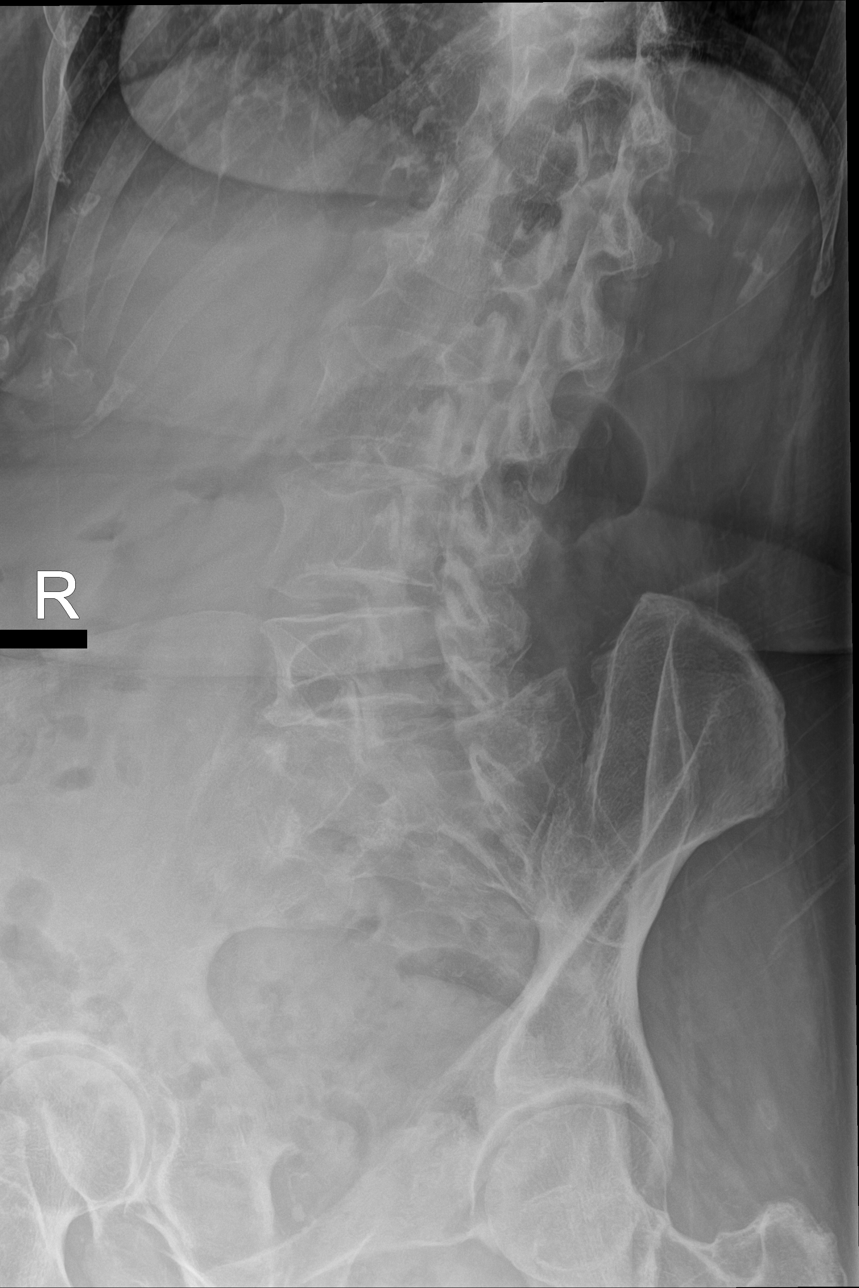

[l-spine obl (2 of 2)]
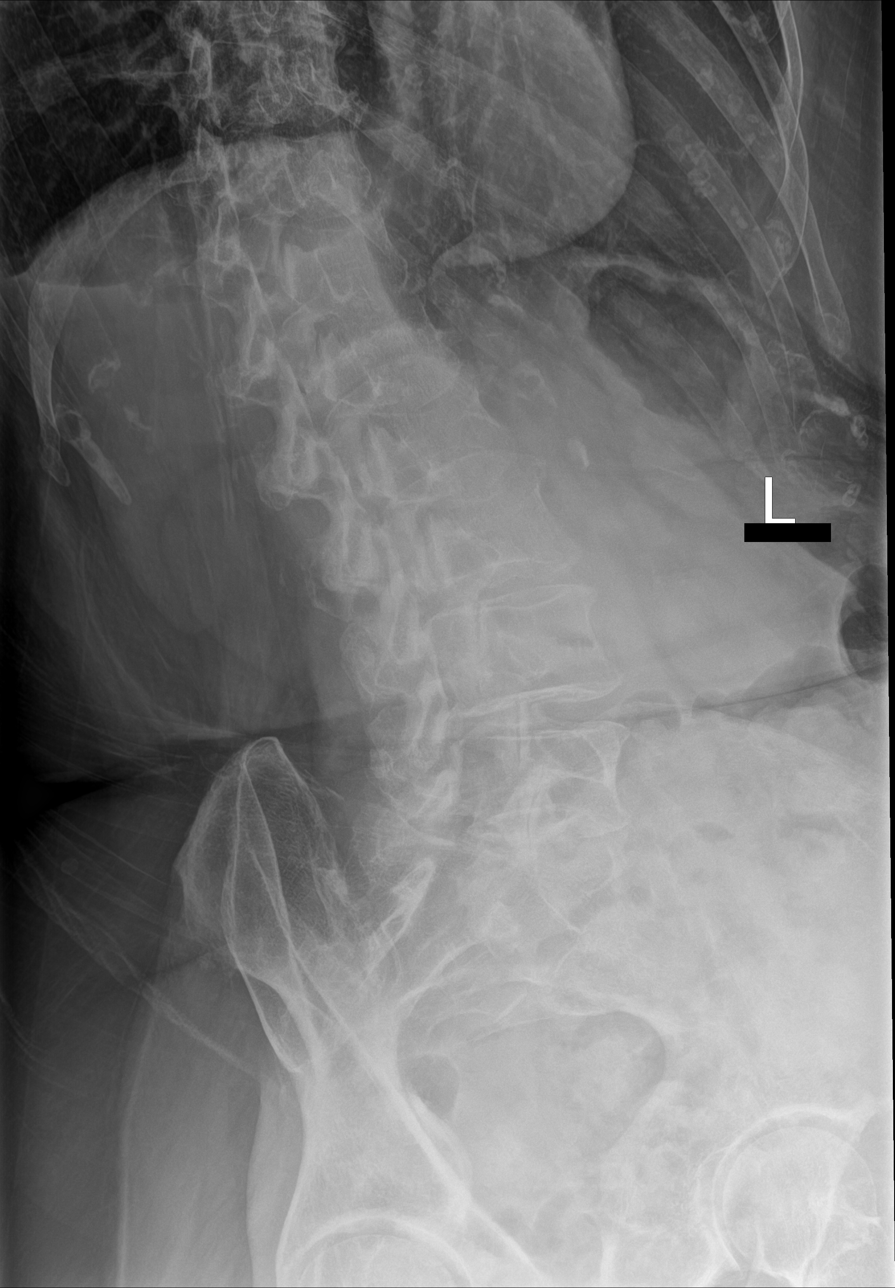

[l-spine lat]
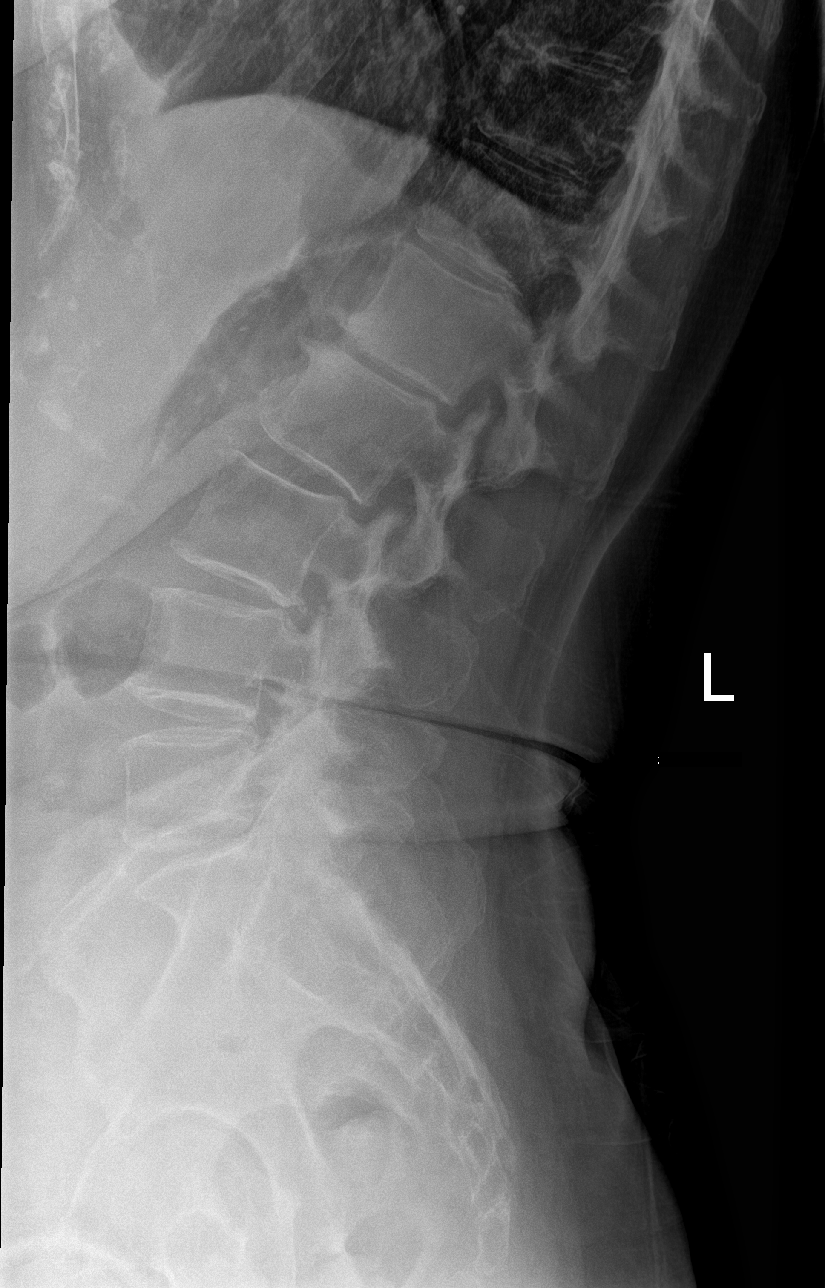

[l-spine spot]
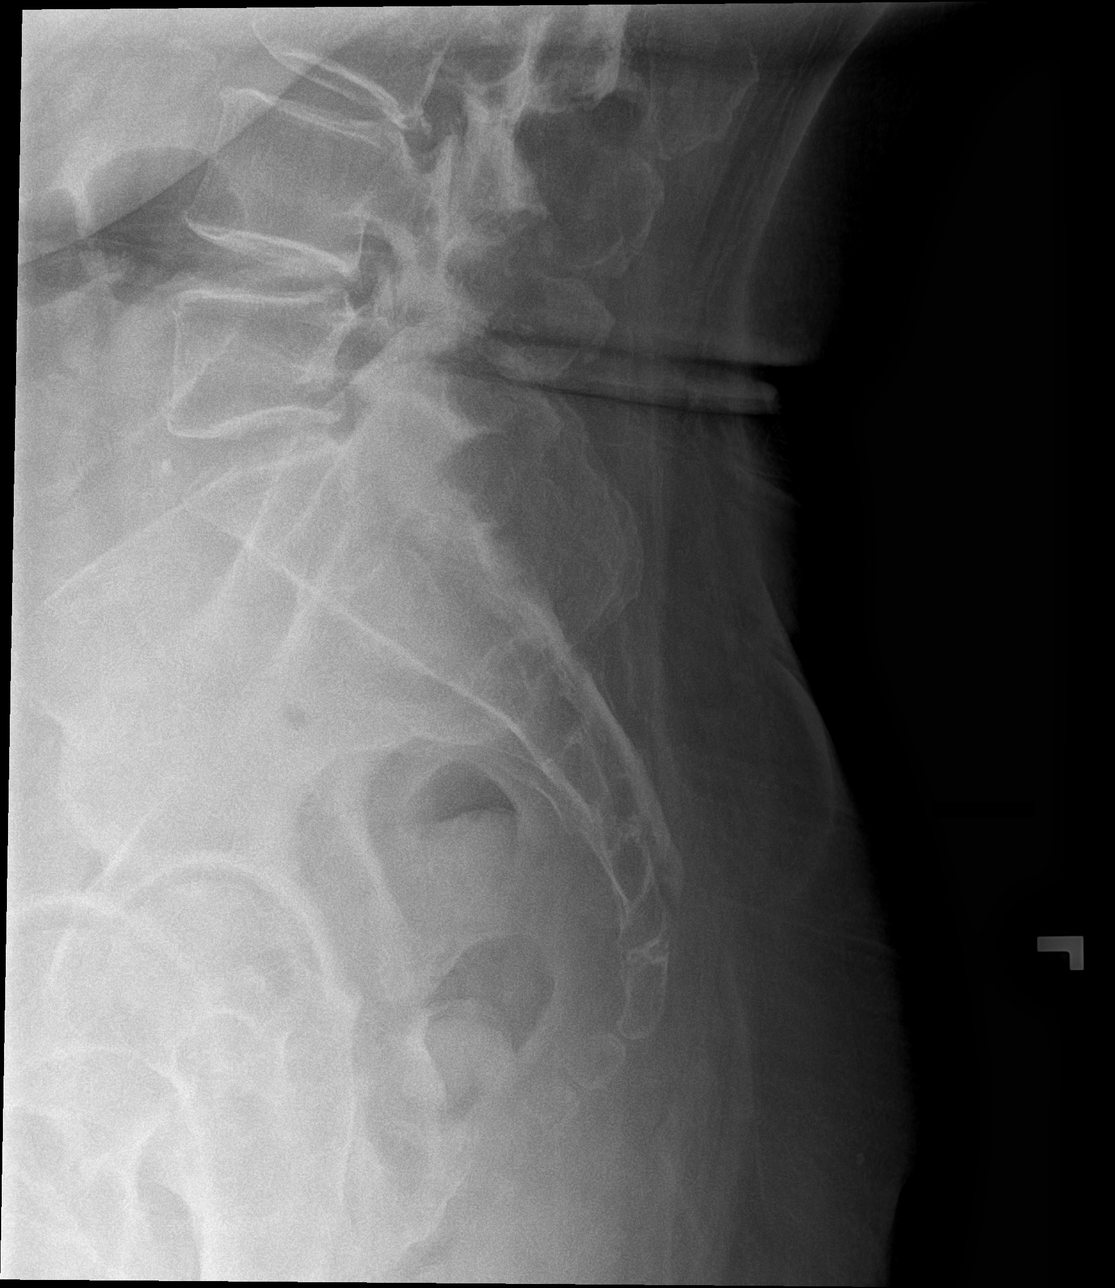

[5 of 5 positions shown; findings below may reference images not displayed]

FINDINGS: Markedly limited evaluation due to overlapping osseous structures
and overlying soft tissues. There is no evidence of lumbar spine
fracture. Alignment is normal. Multilevel mild degenerative changes
of the spine. Atherosclerotic plaque.
IMPRESSION: 1. No acute displaced fracture or traumatic listhesis of the lumbar
spine.
2.  Aortic Atherosclerosis ([id number].M).

## 2022-06-19 ENCOUNTER — Ambulatory Visit (INDEPENDENT_AMBULATORY_CARE_PROVIDER_SITE_OTHER): Payer: Medicare HMO | Admitting: Internal Medicine

## 2022-06-19 ENCOUNTER — Encounter: Payer: Self-pay | Admitting: Internal Medicine

## 2022-06-19 VITALS — BP 132/82 | HR 77 | Ht 66.0 in | Wt 175.8 lb

## 2022-06-19 DIAGNOSIS — B351 Tinea unguium: Secondary | ICD-10-CM | POA: Diagnosis not present

## 2022-06-19 DIAGNOSIS — E785 Hyperlipidemia, unspecified: Secondary | ICD-10-CM | POA: Diagnosis not present

## 2022-06-19 DIAGNOSIS — E875 Hyperkalemia: Secondary | ICD-10-CM | POA: Insufficient documentation

## 2022-06-19 DIAGNOSIS — N3946 Mixed incontinence: Secondary | ICD-10-CM | POA: Insufficient documentation

## 2022-06-19 DIAGNOSIS — Z8673 Personal history of transient ischemic attack (TIA), and cerebral infarction without residual deficits: Secondary | ICD-10-CM

## 2022-06-19 DIAGNOSIS — I10 Essential (primary) hypertension: Secondary | ICD-10-CM

## 2022-06-19 MED ORDER — TERBINAFINE HCL 250 MG PO TABS: 250.0000 mg | ORAL_TABLET | Freq: Every day | ORAL | 2 refills | Status: DC

## 2022-06-19 MED ORDER — AMLODIPINE BESYLATE 5 MG PO TABS: 5.0000 mg | ORAL_TABLET | Freq: Every day | ORAL | 1 refills | Status: DC

## 2022-06-19 MED ORDER — OXYBUTYNIN CHLORIDE ER 10 MG PO TB24: 20.0000 mg | ORAL_TABLET | Freq: Every day | ORAL | 5 refills | Status: DC

## 2022-06-19 MED ORDER — SIMVASTATIN 20 MG PO TABS: 20.0000 mg | ORAL_TABLET | Freq: Every day | ORAL | 3 refills | Status: DC

## 2022-06-19 NOTE — Assessment & Plan Note (Signed)
Well controlled with oxybutynin 20 mg daily now Does not have dry mouth, refilled for now Would prefer Myrbetriq or Gemtesa, but cost can be limiting factor

## 2022-06-19 NOTE — Assessment & Plan Note (Signed)
Well controlled, on Simvastatin currently

## 2022-06-19 NOTE — Assessment & Plan Note (Signed)
BP Readings from Last 1 Encounters:  06/19/22 132/82   Well-controlled Although would be acceptable at her age, but due to her history of cerebral aneurysm, continue amlodipine 5 mg QD for now Would avoid diuretic medicine as she is already struggling with urgent incontinence and nocturia Counseled for compliance with the medications Advised DASH diet and moderate exercise/walking as tolerated

## 2022-06-19 NOTE — Assessment & Plan Note (Signed)
On Aspirin and statin No focal neurologic deficits

## 2022-06-19 NOTE — Assessment & Plan Note (Signed)
Last BMP showed mild hyperkalemia Likely due to excessive banana and potato intake, has cut down Recheck BMP

## 2022-06-19 NOTE — Assessment & Plan Note (Signed)
Persistent despite trying Lamisil cream Started oral Lamisil for 12 weeks

## 2022-06-19 NOTE — Progress Notes (Signed)
Established Patient Office Visit  Subjective:  Patient ID: Teresa Simon, female    DOB: August 15, 1935  Age: 87 y.o. MRN: [id number]  CC:  Chief Complaint  Patient presents with   Hypertension    Six week follow up for hypertension and HLD    HPI Bethan Adamek is a 87 y.o. female with past medical history of cerebral aneurysm s/p clip, TIA and chronic back pain who presents for f/u of her chronic medical conditions.  Her BP was wnl today. She has had wnl BP at home.  She denies any headache, dizziness, chest pain, dyspnea or palpitations currently.  She has a episodes of headache.  She has history of cerebral aneurysm repair.  She still complains of thick and yellowish discoloration of left foot toenails.  She has tried Lamisil cream, which has improved it, but could not resolve it.  Her dose of oxybutynin was increased to 20 mg for urge incontinence by her Ob/Gyn recently.  She requests refill of it.  She denies any dysuria or hematuria currently.  Her chronic low back pain has resolved now as she has stopped working.  She has chronic hip and knee pain, for which she uses topical agents.  She takes simvastatin for HLD.  Past Medical History:  Diagnosis Date   Aneurysm ([place])    Hypercholesteremia     Past Surgical History:  Procedure Laterality Date   CEREBRAL ANEURYSM REPAIR      Family History  Problem Relation Age of Onset   Obesity Sister    Cancer Sister        breast   Kidney disease Sister        dialysis   COPD Sister    Stroke Brother    Diabetes Brother    Heart disease Brother    Thyroid disease Daughter    Arthritis Daughter     Social History   Socioeconomic History   Marital status: Married    Spouse name: Jeneen Rinks   Number of children: 2   Years of education: 12   Highest education level: High school graduate  Occupational History   Not on file  Tobacco Use   Smoking status: Never   Smokeless tobacco: Never  Vaping Use   Vaping Use:  Never used  Substance and Sexual Activity   Alcohol use: No   Drug use: No   Sexual activity: Yes    Birth control/protection: Other-see comments  Other Topics Concern   Not on file  Social History Narrative   Not on file   Social Determinants of Health   Financial Resource Strain: Low Risk  (06/26/2021)   Overall Financial Resource Strain (CARDIA)    Difficulty of Paying Living Expenses: Not hard at all  Food Insecurity: No Food Insecurity (06/26/2021)   Hunger Vital Sign    Worried About Running Out of Food in the Last Year: Never true    [place] in the Last Year: Never true  Transportation Needs: No Transportation Needs (06/26/2021)   PRAPARE - Hydrologist (Medical): No    Lack of Transportation (Non-Medical): No  Physical Activity: Insufficiently Active (06/26/2021)   Exercise Vital Sign    Days of Exercise per Week: 6 days    Minutes of Exercise per Session: 20 min  Stress: No Stress Concern Present (06/26/2021)   [place]    Feeling of Stress : Not at all  Social Connections:  Socially Isolated (06/26/2021)   Social Connection and Isolation Panel [NHANES]    Frequency of Communication with Friends and Family: Once a week    Frequency of Social Gatherings with Friends and Family: Never    Attends Religious Services: Never    Marine scientist or Organizations: No    Attends Archivist Meetings: Never    Marital Status: Married  Human resources officer Violence: Not At Risk (06/26/2021)   Humiliation, Afraid, Rape, and Kick questionnaire    Fear of Current or Ex-Partner: No    Emotionally Abused: No    Physically Abused: No    Sexually Abused: No    Outpatient Medications Prior to Visit  Medication Sig Dispense Refill   amLODipine (NORVASC) 5 MG tablet Take 1 tablet (5 mg total) by mouth daily. 30 tablet 1   oxybutynin (DITROPAN XL) 15 MG 24 hr tablet Take 15 mg by  mouth daily.     simvastatin (ZOCOR) 20 MG tablet Take 1 tablet (20 mg total) by mouth at bedtime. 90 tablet 1   No facility-administered medications prior to visit.    Allergies  Allergen Reactions   Naproxen Diarrhea   Niacin And Related Other (See Comments)    Redness, burning sensation    ROS Review of Systems  Constitutional:  Negative for chills and fever.  HENT:  Negative for congestion, sinus pressure, sinus pain and sore throat.   Eyes:  Negative for pain and discharge.  Respiratory:  Negative for cough and shortness of breath.   Cardiovascular:  Negative for chest pain and palpitations.  Gastrointestinal:  Negative for abdominal pain, diarrhea, nausea and vomiting.  Endocrine: Negative for polydipsia and polyuria.  Genitourinary:  Negative for dysuria, flank pain and hematuria.  Musculoskeletal:  Positive for back pain (Chronic). Negative for neck pain and neck stiffness.  Skin:  Negative for rash.  Neurological:  Negative for dizziness and weakness.  Psychiatric/Behavioral:  Negative for agitation and behavioral problems.       Objective:    Physical Exam Vitals reviewed.  Constitutional:      General: She is not in acute distress.    Appearance: She is not diaphoretic.  HENT:     Head: Normocephalic and atraumatic.     Nose: Nose normal. No congestion.     Mouth/Throat:     Mouth: Mucous membranes are moist.     Pharynx: No posterior oropharyngeal erythema.  Eyes:     General: No scleral icterus.    Extraocular Movements: Extraocular movements intact.  Cardiovascular:     Rate and Rhythm: Normal rate and regular rhythm.     Pulses: Normal pulses.     Heart sounds: Normal heart sounds. No murmur heard. Pulmonary:     Breath sounds: Normal breath sounds. No wheezing or rales.  Musculoskeletal:     Cervical back: Neck supple. No tenderness.     Lumbar back: No swelling or bony tenderness.     Right lower leg: No edema.     Left lower leg: No edema.   Feet:     Left foot:     Toenail Condition: Left toenails are abnormally thick. Fungal disease present. Skin:    General: Skin is warm.     Findings: No rash.  Neurological:     General: No focal deficit present.     Mental Status: She is alert and oriented to person, place, and time.     Sensory: No sensory deficit.     Motor:  No weakness.  Psychiatric:        Mood and Affect: Mood normal.        Behavior: Behavior normal.     BP 132/82 (BP Location: Left Arm, Cuff Size: Normal)   Pulse 77   Ht '5\' 6"'$  (1.676 m)   Wt 175 lb 12.8 oz (79.7 kg)   SpO2 92%   BMI 28.37 kg/m  Wt Readings from Last 3 Encounters:  06/19/22 175 lb 12.8 oz (79.7 kg)  05/07/22 173 lb (78.5 kg)  09/29/21 175 lb 9.6 oz (79.7 kg)    Lab Results  Component Value Date   TSH 1.250 05/07/2022   Lab Results  Component Value Date   WBC 7.7 05/07/2022   HGB 14.3 05/07/2022   HCT 41.8 05/07/2022   MCV 93 05/07/2022   PLT 231 05/07/2022   Lab Results  Component Value Date   NA 145 (H) 05/07/2022   K 5.3 (H) 05/07/2022   CO2 26 05/07/2022   GLUCOSE 103 (H) 05/07/2022   BUN 11 05/07/2022   CREATININE 0.93 05/07/2022   BILITOT 0.6 05/07/2022   ALKPHOS 44 05/07/2022   AST 21 05/07/2022   ALT 16 05/07/2022   PROT 7.0 05/07/2022   ALBUMIN 4.7 05/07/2022   CALCIUM 10.2 05/07/2022   ANIONGAP 9 03/16/2020   EGFR 61 05/07/2022   Lab Results  Component Value Date   CHOL 153 05/07/2022   Lab Results  Component Value Date   HDL 69 05/07/2022   Lab Results  Component Value Date   LDLCALC 63 05/07/2022   Lab Results  Component Value Date   TRIG 118 05/07/2022   Lab Results  Component Value Date   CHOLHDL 2.2 05/07/2022   Lab Results  Component Value Date   HGBA1C 6.0 (H) 05/07/2022      Assessment & Plan:   Problem List Items Addressed This Visit       Cardiovascular and Mediastinum   Essential hypertension - Primary    BP Readings from Last 1 Encounters:  06/19/22 132/82   Well-controlled Although would be acceptable at her age, but due to her history of cerebral aneurysm, continue amlodipine 5 mg QD for now Would avoid diuretic medicine as she is already struggling with urgent incontinence and nocturia Counseled for compliance with the medications Advised DASH diet and moderate exercise/walking as tolerated      Relevant Medications   amLODipine (NORVASC) 5 MG tablet   simvastatin (ZOCOR) 20 MG tablet   Other Relevant Orders   Basic Metabolic Panel (BMET)     Musculoskeletal and Integument   Onychomycosis    Persistent despite trying Lamisil cream Started oral Lamisil for 12 weeks      Relevant Medications   terbinafine (LAMISIL) 250 MG tablet     Other   History of TIA (transient ischemic attack)    On Aspirin and statin No focal neurologic deficits      Hyperlipidemia    Well controlled, on Simvastatin currently      Relevant Medications   amLODipine (NORVASC) 5 MG tablet   simvastatin (ZOCOR) 20 MG tablet   Hyperkalemia    Last BMP showed mild hyperkalemia Likely due to excessive banana and potato intake, has cut down Recheck BMP      Relevant Orders   Basic Metabolic Panel (BMET)   Mixed stress and urge urinary incontinence    Well controlled with oxybutynin 20 mg daily now Does not have dry mouth, refilled for now Would prefer  Myrbetriq or Gemtesa, but cost can be limiting factor      Relevant Medications   oxybutynin (DITROPAN-XL) 10 MG 24 hr tablet   Meds ordered this encounter  Medications   amLODipine (NORVASC) 5 MG tablet    Sig: Take 1 tablet (5 mg total) by mouth daily.    Dispense:  90 tablet    Refill:  1   terbinafine (LAMISIL) 250 MG tablet    Sig: Take 1 tablet (250 mg total) by mouth daily.    Dispense:  30 tablet    Refill:  2   oxybutynin (DITROPAN-XL) 10 MG 24 hr tablet    Sig: Take 2 tablets (20 mg total) by mouth at bedtime.    Dispense:  60 tablet    Refill:  5   simvastatin (ZOCOR) 20 MG  tablet    Sig: Take 1 tablet (20 mg total) by mouth at bedtime.    Dispense:  90 tablet    Refill:  3    Follow-up: Return in about 6 months (around 12/18/2022) for HTN.    Lindell Spar, MD

## 2022-06-19 NOTE — Patient Instructions (Signed)
Please start taking Terbinafine as prescribed.  Please continue taking other medications as prescribed.  Please continue to follow low salt diet and ambulate as tolerated.

## 2022-06-20 LAB — BASIC METABOLIC PANEL
BUN/Creatinine Ratio: 15 (ref 12–28)
BUN: 13 mg/dL (ref 8–27)
CO2: 25 mmol/L (ref 20–29)
Calcium: 10 mg/dL (ref 8.7–10.3)
Chloride: 104 mmol/L (ref 96–106)
Creatinine, Ser: 0.88 mg/dL (ref 0.57–1.00)
Glucose: 94 mg/dL (ref 70–99)
Potassium: 4.7 mmol/L (ref 3.5–5.2)
Sodium: 143 mmol/L (ref 134–144)
eGFR: 65 mL/min/{1.73_m2} (ref >59)

## 2022-06-29 ENCOUNTER — Ambulatory Visit (INDEPENDENT_AMBULATORY_CARE_PROVIDER_SITE_OTHER): Payer: Medicare HMO

## 2022-06-29 DIAGNOSIS — Z0000 Encounter for general adult medical examination without abnormal findings: Secondary | ICD-10-CM

## 2022-06-29 NOTE — Patient Instructions (Signed)
  Ms. Geil , Thank you for taking time to come for your Medicare Wellness Visit. I appreciate your ongoing commitment to your health goals. Please review the following plan we discussed and let me know if I can assist you in the future.   These are the goals we discussed:  Goals      Patient Stated     None at this time.        This is a list of the screening recommended for you and due dates:  Health Maintenance  Topic Date Due   COVID-19 Vaccine (5 - 2023-24 season) 01/23/2022   DTaP/Tdap/Td vaccine (2 - Td or Tdap) 03/03/2023   Medicare Annual Wellness Visit  06/30/2023   Pneumonia Vaccine  Completed   Flu Shot  Completed   DEXA scan (bone density measurement)  Completed   Zoster (Shingles) Vaccine  Completed   HPV Vaccine  Aged Out

## 2022-06-29 NOTE — Progress Notes (Signed)
Subjective:   Teresa Simon is a 87 y.o. female who presents for Medicare Annual (Subsequent) preventive examination.  Review of Systems    I connected with  Teresa Simon on 06/29/22 by a audio enabled telemedicine application and verified that I am speaking with the correct person using two identifiers.  Patient Location: Home  Provider Location: Office/Clinic  I discussed the limitations of evaluation and management by telemedicine. The patient expressed understanding and agreed to proceed.        Objective:    There were no vitals filed for this visit. There is no height or weight on file to calculate BMI.     06/26/2021    9:14 AM 07/21/2015    6:41 PM 07/21/2015   12:38 PM  Advanced Directives  Does Patient Have a Medical Advance Directive? No No No  Would patient like information on creating a medical advance directive? No - Patient declined No - patient declined information     Current Medications (verified) Outpatient Encounter Medications as of 06/29/2022  Medication Sig   amLODipine (NORVASC) 5 MG tablet Take 1 tablet (5 mg total) by mouth daily.   oxybutynin (DITROPAN-XL) 10 MG 24 hr tablet Take 2 tablets (20 mg total) by mouth at bedtime.   simvastatin (ZOCOR) 20 MG tablet Take 1 tablet (20 mg total) by mouth at bedtime.   terbinafine (LAMISIL) 250 MG tablet Take 1 tablet (250 mg total) by mouth daily.   No facility-administered encounter medications on file as of 06/29/2022.    Allergies (verified) Naproxen and Niacin and related   History: Past Medical History:  Diagnosis Date   Aneurysm ([place])    Hypercholesteremia    Past Surgical History:  Procedure Laterality Date   CEREBRAL ANEURYSM REPAIR     Family History  Problem Relation Age of Onset   Obesity Sister    Cancer Sister        breast   Kidney disease Sister        dialysis   COPD Sister    Stroke Brother    Diabetes Brother    Heart disease Brother    Thyroid disease Daughter     Arthritis Daughter    Social History   Socioeconomic History   Marital status: Married    Spouse name: Jeneen Rinks   Number of children: 2   Years of education: 12   Highest education level: High school graduate  Occupational History   Not on file  Tobacco Use   Smoking status: Never   Smokeless tobacco: Never  Vaping Use   Vaping Use: Never used  Substance and Sexual Activity   Alcohol use: No   Drug use: No   Sexual activity: Yes    Birth control/protection: Other-see comments  Other Topics Concern   Not on file  Social History Narrative   Not on file   Social Determinants of Health   Financial Resource Strain: Low Risk  (06/26/2021)   Overall Financial Resource Strain (CARDIA)    Difficulty of Paying Living Expenses: Not hard at all  Food Insecurity: No Food Insecurity (06/26/2021)   Hunger Vital Sign    Worried About Running Out of Food in the Last Year: Never true    [place] in the Last Year: Never true  Transportation Needs: No Transportation Needs (06/26/2021)   PRAPARE - Hydrologist (Medical): No    Lack of Transportation (Non-Medical): No  Physical Activity: Insufficiently Active (06/26/2021)  Exercise Vital Sign    Days of Exercise per Week: 6 days    Minutes of Exercise per Session: 20 min  Stress: No Stress Concern Present (06/26/2021)   [place]    Feeling of Stress : Not at all  Social Connections: Socially Isolated (06/26/2021)   Social Connection and Isolation Panel [NHANES]    Frequency of Communication with Friends and Family: Once a week    Frequency of Social Gatherings with Friends and Family: Never    Attends Religious Services: Never    Marine scientist or Organizations: No    Attends Archivist Meetings: Never    Marital Status: Married    Tobacco Counseling Counseling given: Not Answered   Clinical Intake:    Teresa Simon  , Thank you for taking time to come for your Medicare Wellness Visit. I appreciate your ongoing commitment to your health goals. Please review the following plan we discussed and let me know if I can assist you in the future.   These are the goals we discussed:  Goals      Patient Stated     None at this time.        This is a list of the screening recommended for you and due dates:  Health Maintenance  Topic Date Due   COVID-19 Vaccine (5 - 2023-24 season) 01/23/2022   DTaP/Tdap/Td vaccine (2 - Td or Tdap) 03/03/2023   Medicare Annual Wellness Visit  06/30/2023   Pneumonia Vaccine  Completed   Flu Shot  Completed   DEXA scan (bone density measurement)  Completed   Zoster (Shingles) Vaccine  Completed   HPV Vaccine  Aged Out                 Diabetic?no         Activities of Daily Living     No data to display           Patient Care Team: Lindell Spar, MD as PCP - General (Internal Medicine)  Indicate any recent Medical Services you may have received from other than [facility] providers in the past year (date may be approximate).     Assessment:   This is a routine wellness examination for [name].  Hearing/Vision screen No results found.  Dietary issues and exercise activities discussed:     Goals Addressed   None   Depression Screen    06/19/2022   10:58 AM 05/07/2022   10:20 AM 09/29/2021    2:38 PM 06/26/2021    9:03 AM 05/29/2021    2:48 PM 04/10/2021    2:03 PM 08/08/2020    2:00 PM  PHQ 2/9 Scores  PHQ - 2 Score 0 0 0 0 0 0 0    Fall Risk    06/19/2022   10:58 AM 05/07/2022   10:20 AM 09/29/2021    2:38 PM 06/26/2021    9:15 AM 05/29/2021    2:47 PM  Fall Risk   Falls in the past year? 0 0 0 0 0  Number falls in past yr: 0 0 0 0 0  Injury with Fall? 0 0 0 0 0  Risk for fall due to :   No Fall Risks No Fall Risks No Fall Risks  Follow up   Falls evaluation completed Falls evaluation completed Falls evaluation completed    [place]:  Any stairs in or around the  home? Yes  If so, are there any without handrails? No  Home free of loose throw rugs in walkways, pet beds, electrical cords, etc? Yes  Adequate lighting in your home to reduce risk of falls? Yes   ASSISTIVE DEVICES UTILIZED TO PREVENT FALLS:  Life alert? No  Use of a cane, walker or w/c? No  Grab bars in the bathroom? Yes  Shower chair or bench in shower? Yes  Elevated toilet seat or a handicapped toilet? No    Cognitive Function:        06/26/2021    9:17 AM  6CIT Screen  What Year? 0 points  What month? 0 points  What time? 0 points  Count back from 20 0 points  Months in reverse 0 points  Repeat phrase 2 points  Total Score 2 points    Immunizations Immunization History  Administered Date(s) Administered   Fluad Quad(high Dose 65+) 02/23/2022   Influenza-Unspecified 03/23/2020, 03/12/2021   Moderna Covid-19 Vaccine Bivalent Booster 88yr & up 03/08/2021   Moderna Sars-Covid-2 Vaccination 07/24/2019, 08/21/2019, 03/28/2020   Pneumococcal Conjugate-13 04/05/2014   Pneumococcal Polysaccharide-23 04/01/2010   Tdap 03/02/2013   Zoster Recombinat (Shingrix) 03/11/2018, 06/09/2018    TDAP status: Due, Education has been provided regarding the importance of this vaccine. Advised may receive this vaccine at local pharmacy or Health Dept. Aware to provide a copy of the vaccination record if obtained from local pharmacy or Health Dept. Verbalized acceptance and understanding.  Flu Vaccine status: Up to date  Pneumococcal vaccine status: Up to date  Covid-19 vaccine status: Completed vaccines  Qualifies for Shingles Vaccine? Yes   Zostavax completed Yes   Shingrix Completed?: Yes  Screening Tests Health Maintenance  Topic Date Due   COVID-19 Vaccine (5 - 2023-24 season) 01/23/2022   DTaP/Tdap/Td (2 - Td or Tdap) 03/03/2023   Medicare Annual Wellness (AWV)  06/30/2023   Pneumonia Vaccine 87 Years old   Completed   INFLUENZA VACCINE  Completed   DEXA SCAN  Completed   Zoster Vaccines- Shingrix  Completed   HPV VACCINES  Aged Out    Health Maintenance  Health Maintenance Due  Topic Date Due   COVID-19 Vaccine (5 - 2023-24 season) 01/23/2022    Colorectal cancer screening: No longer required.   Mammogram status: No longer required due to age.  Bone Density status: Completed 06/19/21. Results reflect: Bone density results: NORMAL. Repeat every 2 years.  Lung Cancer Screening: (Low Dose CT Chest recommended if Age 563-80years, 30 pack-year currently smoking OR have quit w/in 15years.) does not qualify.   Lung Cancer Screening Referral: no  Additional Screening:  Hepatitis C Screening: does qualify; Completed   Vision Screening: Recommended annual ophthalmology exams for early detection of glaucoma and other disorders of the eye. Is the patient up to date with their annual eye exam?  No  Who is the provider or what is the name of the office in which the patient attends annual eye exams? N/a If pt is not established with a provider, would they like to be referred to a provider to establish care? No .   Dental Screening: Recommended annual dental exams for proper oral hygiene  Community Resource Referral / Chronic Care Management: CRR required this visit?  No   CCM required this visit?  No      Plan:     I have personally reviewed and noted the following in the patient's chart:   Medical and social history Use of alcohol, tobacco  or illicit drugs  Current medications and supplements including opioid prescriptions. Patient is not currently taking opioid prescriptions. Functional ability and status Nutritional status Physical activity Advanced directives List of other physicians Hospitalizations, surgeries, and ER visits in previous 12 months Vitals Screenings to include cognitive, depression, and falls Referrals and appointments  In addition, I have reviewed and  discussed with patient certain preventive protocols, quality metrics, and best practice recommendations. A written personalized care plan for preventive services as well as general preventive health recommendations were provided to patient.     Quentin Angst, [place]   06/29/2022

## 2022-10-01 DIAGNOSIS — Z1283 Encounter for screening for malignant neoplasm of skin: Secondary | ICD-10-CM | POA: Diagnosis not present

## 2022-10-01 DIAGNOSIS — Z85828 Personal history of other malignant neoplasm of skin: Secondary | ICD-10-CM | POA: Diagnosis not present

## 2022-10-01 DIAGNOSIS — Z7189 Other specified counseling: Secondary | ICD-10-CM | POA: Diagnosis not present

## 2022-10-01 DIAGNOSIS — D234 Other benign neoplasm of skin of scalp and neck: Secondary | ICD-10-CM | POA: Diagnosis not present

## 2022-10-01 DIAGNOSIS — D23121 Other benign neoplasm of skin of left upper eyelid, including canthus: Secondary | ICD-10-CM | POA: Diagnosis not present

## 2022-10-01 DIAGNOSIS — Z872 Personal history of diseases of the skin and subcutaneous tissue: Secondary | ICD-10-CM | POA: Diagnosis not present

## 2022-10-01 DIAGNOSIS — D230 Other benign neoplasm of skin of lip: Secondary | ICD-10-CM | POA: Diagnosis not present

## 2022-10-01 DIAGNOSIS — D23111 Other benign neoplasm of skin of right upper eyelid, including canthus: Secondary | ICD-10-CM | POA: Diagnosis not present

## 2022-10-01 DIAGNOSIS — D2339 Other benign neoplasm of skin of other parts of face: Secondary | ICD-10-CM | POA: Diagnosis not present

## 2022-12-02 ENCOUNTER — Encounter: Payer: Self-pay | Admitting: Internal Medicine

## 2022-12-02 ENCOUNTER — Ambulatory Visit (INDEPENDENT_AMBULATORY_CARE_PROVIDER_SITE_OTHER): Payer: Medicare HMO | Admitting: Internal Medicine

## 2022-12-02 VITALS — BP 127/72 | HR 72 | Ht 67.0 in | Wt 178.4 lb

## 2022-12-02 DIAGNOSIS — R7303 Prediabetes: Secondary | ICD-10-CM | POA: Insufficient documentation

## 2022-12-02 DIAGNOSIS — I10 Essential (primary) hypertension: Secondary | ICD-10-CM | POA: Diagnosis not present

## 2022-12-02 DIAGNOSIS — E782 Mixed hyperlipidemia: Secondary | ICD-10-CM

## 2022-12-02 DIAGNOSIS — N3946 Mixed incontinence: Secondary | ICD-10-CM

## 2022-12-02 MED ORDER — MIRABEGRON ER 50 MG PO TB24: 50.0000 mg | ORAL_TABLET | Freq: Every day | ORAL | 5 refills | Status: DC

## 2022-12-02 MED ORDER — AMLODIPINE BESYLATE 5 MG PO TABS: 5.0000 mg | ORAL_TABLET | Freq: Every day | ORAL | 1 refills | Status: DC

## 2022-12-02 NOTE — Patient Instructions (Signed)
Please start taking Myrbetriq for urinary incontinence. Please stop taking Oxybutynin once you receive Myrbetriq.  Please take Amlodipine in the morning.  Okay to take Pepcid as needed for acid reflux.  Please continue to take other medications as prescribed.  Please continue to follow low salt diet and perform moderate exercise/walking at least 150 mins/week.

## 2022-12-02 NOTE — Assessment & Plan Note (Signed)
BP Readings from Last 1 Encounters:  12/02/22 127/72   Well-controlled Although would be acceptable at her age, but due to her history of cerebral aneurysm, continue amlodipine 5 mg QD for now Would avoid diuretic medicine as she is already struggling with urgent incontinence and nocturia Counseled for compliance with the medications Advised DASH diet and moderate exercise/walking as tolerated

## 2022-12-02 NOTE — Assessment & Plan Note (Addendum)
Uncontrolled with oxybutynin 20 mg daily now Has dry mouth, her sore throat is also likely due to dry mouth Would prefer Myrbetriq, prescription sent

## 2022-12-02 NOTE — Assessment & Plan Note (Signed)
Lab Results  Component Value Date   HGBA1C 6.0 (H) 05/07/2022   Advised to follow DASH diet

## 2022-12-02 NOTE — Assessment & Plan Note (Signed)
Well controlled, on Simvastatin currently 

## 2022-12-02 NOTE — Progress Notes (Signed)
Established Patient Office Visit  Subjective:  Patient ID: Teresa Simon, female    DOB: 02/19/1936  Age: 87 y.o. MRN: [id number]  CC:  Chief Complaint  Patient presents with   Hypertension    Follow up    HPI Teresa Simon is a 87 y.o. female with past medical history of cerebral aneurysm s/p clip, TIA and chronic back pain who presents for f/u of her chronic medical conditions.  Her BP was wnl today. She has had wnl BP at home.  She denies any headache, dizziness, chest pain, dyspnea or palpitations currently.  She has intermittent episodes of headache, which improve with Tylenol.  She has history of cerebral aneurysm repair.  Her dose of oxybutynin was increased to 20 mg for urge incontinence by her Ob/Gyn.  She reports dry mouth from it.  She states that she still has urinary urgency despite increasing dose of oxybutynin.  She denies any dysuria or hematuria currently.  She reports scratchy throat, especially at nighttime, which could be due to dry mouth.  She agrees to perform warm water gargling.  Her chronic low back pain has resolved now as she has stopped working.  She has chronic hip and knee pain, for which she uses topical agents.  She takes simvastatin for HLD.  Past Medical History:  Diagnosis Date   Aneurysm ([facility])    Hypercholesteremia     Past Surgical History:  Procedure Laterality Date   CEREBRAL ANEURYSM REPAIR      Family History  Problem Relation Age of Onset   Obesity Sister    Cancer Sister        breast   Kidney disease Sister        dialysis   COPD Sister    Stroke Brother    Diabetes Brother    Heart disease Brother    Thyroid disease Daughter    Arthritis Daughter     Social History   Socioeconomic History   Marital status: Married    Spouse name: Fayrene Fearing   Number of children: 2   Years of education: 12   Highest education level: High school graduate  Occupational History   Not on file  Tobacco Use   Smoking status: Never    Smokeless tobacco: Never  Vaping Use   Vaping Use: Never used  Substance and Sexual Activity   Alcohol use: No   Drug use: No   Sexual activity: Yes    Birth control/protection: Other-see comments  Other Topics Concern   Not on file  Social History Narrative   Not on file   Social Determinants of Health   Financial Resource Strain: Low Risk  (06/29/2022)   Overall Financial Resource Strain (CARDIA)    Difficulty of Paying Living Expenses: Not hard at all  Food Insecurity: No Food Insecurity (06/29/2022)   Hunger Vital Sign    Worried About Running Out of Food in the Last Year: Never true    Ran Out of Food in the Last Year: Never true  Transportation Needs: No Transportation Needs (06/29/2022)   PRAPARE - Administrator, Civil Service (Medical): No    Lack of Transportation (Non-Medical): No  Physical Activity: Sufficiently Active (06/29/2022)   Exercise Vital Sign    Days of Exercise per Week: 5 days    Minutes of Exercise per Session: 30 min  Stress: No Stress Concern Present (06/29/2022)   [employer] of Occupational Health - Occupational Stress Questionnaire    Feeling of Stress :  Not at all  Social Connections: Moderately Integrated (06/29/2022)   Social Connection and Isolation Panel [NHANES]    Frequency of Communication with Friends and Family: More than three times a week    Frequency of Social Gatherings with Friends and Family: Three times a week    Attends Religious Services: More than 4 times per year    Active Member of Clubs or Organizations: No    Attends Banker Meetings: Never    Marital Status: Married  Catering manager Violence: Not At Risk (06/29/2022)   Humiliation, Afraid, Rape, and Kick questionnaire    Fear of Current or Ex-Partner: No    Emotionally Abused: No    Physically Abused: No    Sexually Abused: No    Outpatient Medications Prior to Visit  Medication Sig Dispense Refill   oxybutynin (DITROPAN-XL) 10 MG 24 hr  tablet Take 2 tablets (20 mg total) by mouth at bedtime. 60 tablet 5   simvastatin (ZOCOR) 20 MG tablet Take 1 tablet (20 mg total) by mouth at bedtime. 90 tablet 3   amLODipine (NORVASC) 5 MG tablet Take 1 tablet (5 mg total) by mouth daily. 90 tablet 1   terbinafine (LAMISIL) 250 MG tablet Take 1 tablet (250 mg total) by mouth daily. 30 tablet 2   No facility-administered medications prior to visit.    Allergies  Allergen Reactions   Naproxen Diarrhea   Niacin And Related Other (See Comments)    Redness, burning sensation    ROS Review of Systems  Constitutional:  Negative for chills and fever.  HENT:  Positive for sore throat. Negative for congestion, sinus pressure and sinus pain.   Eyes:  Negative for pain and discharge.  Respiratory:  Negative for cough and shortness of breath.   Cardiovascular:  Negative for chest pain and palpitations.  Gastrointestinal:  Negative for abdominal pain, diarrhea, nausea and vomiting.  Endocrine: Negative for polydipsia and polyuria.  Genitourinary:  Negative for dysuria, flank pain and hematuria.  Musculoskeletal:  Positive for back pain (Chronic). Negative for neck pain and neck stiffness.  Skin:  Negative for rash.  Neurological:  Negative for dizziness and weakness.  Psychiatric/Behavioral:  Negative for agitation and behavioral problems.       Objective:    Physical Exam Vitals reviewed.  Constitutional:      General: She is not in acute distress.    Appearance: She is not diaphoretic.  HENT:     Head: Normocephalic and atraumatic.     Nose: Nose normal. No congestion.     Mouth/Throat:     Mouth: Mucous membranes are moist.     Pharynx: No posterior oropharyngeal erythema.  Eyes:     General: No scleral icterus.    Extraocular Movements: Extraocular movements intact.  Cardiovascular:     Rate and Rhythm: Normal rate and regular rhythm.     Pulses: Normal pulses.     Heart sounds: Normal heart sounds. No murmur  heard. Pulmonary:     Breath sounds: Normal breath sounds. No wheezing or rales.  Musculoskeletal:     Cervical back: Neck supple. No tenderness.     Lumbar back: No swelling or bony tenderness.     Right lower leg: No edema.     Left lower leg: No edema.  Feet:     Left foot:     Toenail Condition: Left toenails are abnormally thick.  Skin:    General: Skin is warm.     Findings: No rash.  Neurological:     General: No focal deficit present.     Mental Status: She is alert and oriented to person, place, and time.     Sensory: No sensory deficit.     Motor: No weakness.  Psychiatric:        Mood and Affect: Mood normal.        Behavior: Behavior normal.     BP 127/72   Pulse 72   Ht 5\' 7"  (1.702 m)   Wt 178 lb 6.4 oz (80.9 kg)   SpO2 93%   BMI 27.94 kg/m  Wt Readings from Last 3 Encounters:  12/02/22 178 lb 6.4 oz (80.9 kg)  06/19/22 175 lb 12.8 oz (79.7 kg)  05/07/22 173 lb (78.5 kg)    Lab Results  Component Value Date   TSH 1.250 05/07/2022   Lab Results  Component Value Date   WBC 7.7 05/07/2022   HGB 14.3 05/07/2022   HCT 41.8 05/07/2022   MCV 93 05/07/2022   PLT 231 05/07/2022   Lab Results  Component Value Date   NA 143 06/19/2022   K 4.7 06/19/2022   CO2 25 06/19/2022   GLUCOSE 94 06/19/2022   BUN 13 06/19/2022   CREATININE 0.88 06/19/2022   BILITOT 0.6 05/07/2022   ALKPHOS 44 05/07/2022   AST 21 05/07/2022   ALT 16 05/07/2022   PROT 7.0 05/07/2022   ALBUMIN 4.7 05/07/2022   CALCIUM 10.0 06/19/2022   ANIONGAP 9 03/16/2020   EGFR 65 06/19/2022   Lab Results  Component Value Date   CHOL 153 05/07/2022   Lab Results  Component Value Date   HDL 69 05/07/2022   Lab Results  Component Value Date   LDLCALC 63 05/07/2022   Lab Results  Component Value Date   TRIG 118 05/07/2022   Lab Results  Component Value Date   CHOLHDL 2.2 05/07/2022   Lab Results  Component Value Date   HGBA1C 6.0 (H) 05/07/2022      Assessment & Plan:    Problem List Items Addressed This Visit       Cardiovascular and Mediastinum   Essential hypertension - Primary    BP Readings from Last 1 Encounters:  12/02/22 127/72  Well-controlled Although would be acceptable at her age, but due to her history of cerebral aneurysm, continue amlodipine 5 mg QD for now Would avoid diuretic medicine as she is already struggling with urgent incontinence and nocturia Counseled for compliance with the medications Advised DASH diet and moderate exercise/walking as tolerated      Relevant Medications   amLODipine (NORVASC) 5 MG tablet   Other Relevant Orders   CMP14+EGFR     Other   Hyperlipidemia    Well controlled, on Simvastatin currently      Relevant Medications   amLODipine (NORVASC) 5 MG tablet   Mixed stress and urge urinary incontinence    Uncontrolled with oxybutynin 20 mg daily now Has dry mouth, her sore throat is also likely due to dry mouth Would prefer Myrbetriq, prescription sent      Relevant Medications   mirabegron ER (MYRBETRIQ) 50 MG TB24 tablet   Prediabetes    Lab Results  Component Value Date   HGBA1C 6.0 (H) 05/07/2022  Advised to follow DASH diet      Relevant Orders   CMP14+EGFR   Hemoglobin A1c   Meds ordered this encounter  Medications   mirabegron ER (MYRBETRIQ) 50 MG TB24 tablet    Sig: Take 1 tablet (  50 mg total) by mouth daily.    Dispense:  30 tablet    Refill:  5   amLODipine (NORVASC) 5 MG tablet    Sig: Take 1 tablet (5 mg total) by mouth daily.    Dispense:  90 tablet    Refill:  1    Follow-up: Return in about 5 months (around 05/04/2023) for Annual physical (after 12/14).    Anabel Halon, MD

## 2022-12-03 LAB — CMP14+EGFR
ALT: 30 IU/L (ref 0–32)
AST: 29 IU/L (ref 0–40)
Albumin: 4.7 g/dL (ref 3.7–4.7)
Alkaline Phosphatase: 48 IU/L (ref 44–121)
BUN/Creatinine Ratio: 14 (ref 12–28)
BUN: 13 mg/dL (ref 8–27)
Bilirubin Total: 0.5 mg/dL (ref 0.0–1.2)
CO2: 26 mmol/L (ref 20–29)
Calcium: 9.8 mg/dL (ref 8.7–10.3)
Chloride: 102 mmol/L (ref 96–106)
Creatinine, Ser: 0.9 mg/dL (ref 0.57–1.00)
Globulin, Total: 2.2 g/dL (ref 1.5–4.5)
Glucose: 101 mg/dL — ABNORMAL HIGH (ref 70–99)
Potassium: 5.1 mmol/L (ref 3.5–5.2)
Sodium: 143 mmol/L (ref 134–144)
Total Protein: 6.9 g/dL (ref 6.0–8.5)
eGFR: 63 mL/min/{1.73_m2} (ref >59)

## 2022-12-03 LAB — HEMOGLOBIN A1C
Est. average glucose Bld gHb Est-mCnc: 134 mg/dL
Hgb A1c MFr Bld: 6.3 % — ABNORMAL HIGH (ref 4.8–5.6)

## 2022-12-04 ENCOUNTER — Encounter: Payer: Self-pay | Admitting: Internal Medicine

## 2022-12-25 ENCOUNTER — Ambulatory Visit: Payer: Medicare HMO | Admitting: Internal Medicine

## 2023-03-10 ENCOUNTER — Emergency Department (HOSPITAL_COMMUNITY): Payer: Medicare HMO

## 2023-03-10 ENCOUNTER — Emergency Department (HOSPITAL_COMMUNITY)
Admission: EM | Admit: 2023-03-10 | Discharge: 2023-03-11 | Disposition: A | Discharge status: Home / Self Care | Payer: Medicare HMO | Attending: Emergency Medicine | Admitting: Emergency Medicine

## 2023-03-10 ENCOUNTER — Other Ambulatory Visit: Payer: Self-pay

## 2023-03-10 ENCOUNTER — Encounter (HOSPITAL_COMMUNITY): Payer: Self-pay

## 2023-03-10 DIAGNOSIS — R079 Chest pain, unspecified: Secondary | ICD-10-CM | POA: Insufficient documentation

## 2023-03-10 DIAGNOSIS — S51011A Laceration without foreign body of right elbow, initial encounter: Secondary | ICD-10-CM | POA: Diagnosis not present

## 2023-03-10 DIAGNOSIS — W0110XA Fall on same level from slipping, tripping and stumbling with subsequent striking against unspecified object, initial encounter: Secondary | ICD-10-CM | POA: Diagnosis not present

## 2023-03-10 DIAGNOSIS — Z743 Need for continuous supervision: Secondary | ICD-10-CM | POA: Diagnosis not present

## 2023-03-10 DIAGNOSIS — R911 Solitary pulmonary nodule: Secondary | ICD-10-CM | POA: Diagnosis not present

## 2023-03-10 DIAGNOSIS — I700 Atherosclerosis of aorta: Secondary | ICD-10-CM | POA: Diagnosis not present

## 2023-03-10 DIAGNOSIS — R519 Headache, unspecified: Secondary | ICD-10-CM | POA: Diagnosis not present

## 2023-03-10 DIAGNOSIS — I671 Cerebral aneurysm, nonruptured: Secondary | ICD-10-CM | POA: Diagnosis not present

## 2023-03-10 DIAGNOSIS — R0789 Other chest pain: Secondary | ICD-10-CM | POA: Diagnosis not present

## 2023-03-10 DIAGNOSIS — W19XXXA Unspecified fall, initial encounter: Secondary | ICD-10-CM

## 2023-03-10 DIAGNOSIS — Y92014 Private driveway to single-family (private) house as the place of occurrence of the external cause: Secondary | ICD-10-CM | POA: Diagnosis not present

## 2023-03-10 DIAGNOSIS — M25561 Pain in right knee: Secondary | ICD-10-CM | POA: Insufficient documentation

## 2023-03-10 DIAGNOSIS — R42 Dizziness and giddiness: Secondary | ICD-10-CM | POA: Insufficient documentation

## 2023-03-10 DIAGNOSIS — S299XXA Unspecified injury of thorax, initial encounter: Secondary | ICD-10-CM | POA: Diagnosis not present

## 2023-03-10 DIAGNOSIS — M542 Cervicalgia: Secondary | ICD-10-CM | POA: Diagnosis not present

## 2023-03-10 LAB — CBC
HCT: 43.2 % (ref 36.0–46.0)
Hemoglobin: 14.4 g/dL (ref 12.0–15.0)
MCH: 31 pg (ref 26.0–34.0)
MCHC: 33.3 g/dL (ref 30.0–36.0)
MCV: 92.9 fL (ref 80.0–100.0)
Platelets: 200 10*3/uL (ref 150–400)
RBC: 4.65 MIL/uL (ref 3.87–5.11)
RDW: 12.1 % (ref 11.5–15.5)
WBC: 7.5 10*3/uL (ref 4.0–10.5)
nRBC: 0 % (ref 0.0–0.2)

## 2023-03-10 LAB — BASIC METABOLIC PANEL
Anion gap: 11 (ref 5–15)
BUN: 16 mg/dL (ref 8–23)
CO2: 27 mmol/L (ref 22–32)
Calcium: 9.4 mg/dL (ref 8.9–10.3)
Chloride: 101 mmol/L (ref 98–111)
Creatinine, Ser: 0.93 mg/dL (ref 0.44–1.00)
GFR, Estimated: >60 mL/min (ref >60)
Glucose, Bld: 111 mg/dL — ABNORMAL HIGH (ref 70–99)
Potassium: 4.5 mmol/L (ref 3.5–5.1)
Sodium: 139 mmol/L (ref 135–145)

## 2023-03-10 LAB — TROPONIN I (HIGH SENSITIVITY): Troponin I (High Sensitivity): 12 ng/L (ref <18)

## 2023-03-10 MED ORDER — FENTANYL CITRATE PF 50 MCG/ML IJ SOSY
25.0000 ug | PREFILLED_SYRINGE | Freq: Once | INTRAMUSCULAR | Status: AC
  Administered 2023-03-10: 25 ug via INTRAVENOUS
  Filled 2023-03-10: qty 1

## 2023-03-10 MED ORDER — FENTANYL CITRATE PF 50 MCG/ML IJ SOSY
50.0000 ug | PREFILLED_SYRINGE | Freq: Once | INTRAMUSCULAR | Status: AC
  Administered 2023-03-10: 50 ug via INTRAVENOUS
  Filled 2023-03-10: qty 1

## 2023-03-10 NOTE — ED Notes (Signed)
Pharmacy tech approached this nurse, reported to this nurse that pt told her she had hit her head "pretty hard around the same spot where she had aneurism repair in 1999" Dr Leanna Sato made aware, and verbal order received for head CT and cervical spine.

## 2023-03-10 NOTE — ED Provider Notes (Signed)
11:23 PM Assumed care from Dr. Leanna Sato, please see their note for full history, physical and decision making until this point. In brief this is a 87 y.o. year old female who presented to the ED tonight with Fall     Fall, hit head, rolled down driveway. Now with bad sternal pain. Pendign ct's.   Discharge instructions, including strict return precautions for new or worsening symptoms, given. Patient and/or family verbalized understanding and agreement with the plan as described.   Labs, studies and imaging reviewed by myself and considered in medical decision making if ordered. Imaging interpreted by radiology.  Labs Reviewed  BASIC METABOLIC PANEL - Abnormal; Notable for the following components:      Result Value   Glucose, Bld 111 (*)    All other components within normal limits  CBC  TROPONIN I (HIGH SENSITIVITY)    DG Knee Complete 4 Views Right  Final Result    DG Chest 2 View  Final Result    CT Head Wo Contrast    (Results Pending)  CT Cervical Spine Wo Contrast    (Results Pending)  CT Chest Wo Contrast    (Results Pending)    No follow-ups on file.

## 2023-03-10 NOTE — ED Triage Notes (Addendum)
Pt brought in by [facility] for a fall. Pt states falling backwards in her drive way and having "the wind" knocked out of her. Per pt's daughter she watched the camera video and pt lives [place] and rolled down the driveway after falling. Pt is having central chest pain that does no radiate anywhere but does appear real SOB. Rt elbow "hurts", skin abrasion noted. NIH 0.

## 2023-03-10 NOTE — ED Provider Notes (Signed)
[place] EMERGENCY DEPARTMENT AT [facility] Provider Note   CSN: [id number] Arrival date & time: 03/10/23  1801     History  Chief Complaint  Patient presents with   Marletta Lor    Teresa Simon is a 87 y.o. female.  87 year old female presenting from home after fall with chest pain and difficulty breathing.  Patient states she had a mechanical fall in which she was in her driveway and fell backwards.  She states she fell onto her butt and then hit the back of her head.  She then proceeded to roll down the driveway.  She admits to severe pain in the central chest location without radiation. Pain is reproducible and worse with movement just breathing.  Patient also admits to knee pain.  She denies any headache.  She denies any spinal tenderness.  Patient also has a laceration to her right elbow but denies any bony pain.  Denies any other pain at this time.  Denies LOC.  Denies blood thinner use.  History pertinent for a cerebral aneurysm with coiling.  The history is provided by the patient. No language interpreter was used.  Fall Associated symptoms include chest pain. Pertinent negatives include no abdominal pain and no shortness of breath.       Home Medications Prior to Admission medications   Medication Sig Start Date End Date Taking? Authorizing Provider  acetaminophen (TYLENOL) 500 MG tablet Take 500 mg by mouth every 6 (six) hours as needed for mild pain (pain score 1-3).   Yes [provider]  amLODipine (NORVASC) 5 MG tablet Take 1 tablet (5 mg total) by mouth daily. 12/02/22  Yes Anabel Halon, MD  Coenzyme Q10 100 MG capsule Take 100 mg by mouth daily.   Yes [provider]  Lido-Capsaicin-Men-Methyl Sal (1ST MEDX-PATCH/ LIDOCAINE) 4-0.025-5-20 % PTCH Apply 1 patch topically daily as needed for up to 10 days (pain). 03/11/23 03/21/23 Yes Mesner, Barbara Cower, MD  meloxicam (MOBIC) 15 MG tablet Take 1 tablet (15 mg total) by mouth daily. Do NOT take  with motrin, ibuprofen, aleve, aspirin or other NSAID's medications 03/11/23  Yes Mesner, Barbara Cower, MD  mirabegron ER (MYRBETRIQ) 50 MG TB24 tablet Take 1 tablet (50 mg total) by mouth daily. 12/02/22  Yes Anabel Halon, MD  Misc Natural Products (OSTEO BI-FLEX ADV JOINT SHIELD PO) Take 2 capsules by mouth daily.   Yes [provider]  oxyCODONE-acetaminophen (PERCOCET) 5-325 MG tablet Take 1 tablet by mouth every 6 (six) hours as needed for severe pain (pain score 7-10). 03/11/23  Yes Mesner, Barbara Cower, MD  simvastatin (ZOCOR) 20 MG tablet Take 1 tablet (20 mg total) by mouth at bedtime. 06/19/22  Yes Anabel Halon, MD  Specialty Vitamins Products (MM BIOTIN/KERATIN) CAPS Take 2 capsules by mouth daily.   Yes [provider]  VITAMIN E PO Take 1 tablet by mouth daily.   Yes [provider]      Allergies    Naproxen and Niacin and related    Review of Systems   Review of Systems  Constitutional:  Negative for chills and fever.  HENT:  Negative for ear pain and sore throat.   Eyes:  Negative for pain and visual disturbance.  Respiratory:  Negative for cough and shortness of breath.   Cardiovascular:  Positive for chest pain. Negative for palpitations.  Gastrointestinal:  Negative for abdominal pain and vomiting.  Genitourinary:  Negative for dysuria and hematuria.  Musculoskeletal:  Negative for arthralgias and back  pain.  Skin:  Positive for wound. Negative for color change and rash.  Neurological:  Negative for seizures and syncope.  All other systems reviewed and are negative.   Physical Exam Updated Vital Signs BP (!) 149/58   Pulse 75   Temp 98.5 F (36.9 C)   Resp 20   Ht 5\' 7"  (1.702 m)   Wt 80.9 kg   SpO2 95%   BMI 27.93 kg/m  Physical Exam Vitals and nursing note reviewed.  Constitutional:      General: She is not in acute distress.    Appearance: She is well-developed.  HENT:     Head: Normocephalic and atraumatic.  Eyes:      Conjunctiva/sclera: Conjunctivae normal.  Cardiovascular:     Rate and Rhythm: Normal rate and regular rhythm.     Heart sounds: No murmur heard. Pulmonary:     Effort: Pulmonary effort is normal. No respiratory distress.     Breath sounds: Normal breath sounds.  Chest:     Chest wall: Tenderness present.    Abdominal:     Palpations: Abdomen is soft.     Tenderness: There is no abdominal tenderness.  Musculoskeletal:        General: No swelling.     Right shoulder: Normal.     Left shoulder: Normal.     Right upper arm: Normal.     Left upper arm: Normal.     Right elbow: Laceration (skin tear) present.     Left elbow: Normal.     Right forearm: Normal.     Left forearm: Normal.     Right wrist: Normal.     Left wrist: Normal.     Right hand: Normal.     Left hand: Normal.     Cervical back: Neck supple. No tenderness or bony tenderness.     Thoracic back: No tenderness or bony tenderness.     Lumbar back: No tenderness or bony tenderness.     Right hip: Normal.     Left hip: Normal.     Right upper leg: Normal.     Left upper leg: Normal.     Right knee: Bony tenderness present.     Left knee: Normal.     Right lower leg: Normal.     Left lower leg: Normal.     Right ankle: Normal.     Left ankle: Normal.  Skin:    General: Skin is warm and dry.     Capillary Refill: Capillary refill takes less than 2 seconds.     Findings: Wound present.       Neurological:     Mental Status: She is alert.  Psychiatric:        Mood and Affect: Mood normal.     ED Results / Procedures / Treatments   Labs (all labs ordered are listed, but only abnormal results are displayed) Labs Reviewed  BASIC METABOLIC PANEL - Abnormal; Notable for the following components:      Result Value   Glucose, Bld 111 (*)    All other components within normal limits  CBC  TROPONIN I (HIGH SENSITIVITY)    EKG EKG Interpretation Date/Time:  Wednesday March 10 2023 18:37:21  EDT Ventricular Rate:  79 PR Interval:  187 QRS Duration:  139 QT Interval:  454 QTC Calculation: 521 R Axis:   132  Text Interpretation: Right and left arm electrode reversal, interpretation assumes no reversal Sinus rhythm Left bundle branch block Confirmed by  Edwin Dada ([id number]) on 03/10/2023 11:22:29 PM  Radiology CT Head Wo Contrast  Result Date: 03/11/2023 CLINICAL DATA:  Head and neck pain, fall EXAM: CT HEAD WITHOUT CONTRAST CT CERVICAL SPINE WITHOUT CONTRAST TECHNIQUE: Multidetector CT imaging of the head and cervical spine was performed following the standard protocol without intravenous contrast. Multiplanar CT image reconstructions of the cervical spine were also generated. RADIATION DOSE REDUCTION: This exam was performed according to the departmental dose-optimization program which includes automated exposure control, adjustment of the mA and/or kV according to patient size and/or use of iterative reconstruction technique. COMPARISON:  03/16/2020 CT head, 03/02/2013 CT head and cervical spine FINDINGS: CT HEAD FINDINGS Brain: No evidence of acute infarct, hemorrhage, mass, mass effect, or midline shift. No hydrocephalus or extra-axial fluid collection. Vascular: No hyperdense vessel. Aneurysm clip in the region of the right ICA terminus. Skull: Negative for fracture or focal lesion. Prior right pterional craniotomy. Sinuses/Orbits: No acute finding. Other: The mastoid air cells are well aerated. CT CERVICAL SPINE FINDINGS Alignment: No traumatic listhesis. Trace anterolisthesis of C4 on C5 appears facet mediated. Skull base and vertebrae: No acute fracture or suspicious osseous lesion. Osseous fusion across the C3-C4 facets and disc space. Soft tissues and spinal canal: No prevertebral fluid or swelling. No visible canal hematoma. Disc levels: Degenerative changes in the cervical spine.No high-grade spinal canal stenosis. Upper chest: Not included in the field of view. IMPRESSION: 1. No  acute intracranial process. 2. No acute cervical spine fracture or traumatic listhesis. Electronically Signed   By: Wiliam Ke M.D.   On: 03/11/2023 00:09   CT Cervical Spine Wo Contrast  Result Date: 03/11/2023 CLINICAL DATA:  Head and neck pain, fall EXAM: CT HEAD WITHOUT CONTRAST CT CERVICAL SPINE WITHOUT CONTRAST TECHNIQUE: Multidetector CT imaging of the head and cervical spine was performed following the standard protocol without intravenous contrast. Multiplanar CT image reconstructions of the cervical spine were also generated. RADIATION DOSE REDUCTION: This exam was performed according to the departmental dose-optimization program which includes automated exposure control, adjustment of the mA and/or kV according to patient size and/or use of iterative reconstruction technique. COMPARISON:  03/16/2020 CT head, 03/02/2013 CT head and cervical spine FINDINGS: CT HEAD FINDINGS Brain: No evidence of acute infarct, hemorrhage, mass, mass effect, or midline shift. No hydrocephalus or extra-axial fluid collection. Vascular: No hyperdense vessel. Aneurysm clip in the region of the right ICA terminus. Skull: Negative for fracture or focal lesion. Prior right pterional craniotomy. Sinuses/Orbits: No acute finding. Other: The mastoid air cells are well aerated. CT CERVICAL SPINE FINDINGS Alignment: No traumatic listhesis. Trace anterolisthesis of C4 on C5 appears facet mediated. Skull base and vertebrae: No acute fracture or suspicious osseous lesion. Osseous fusion across the C3-C4 facets and disc space. Soft tissues and spinal canal: No prevertebral fluid or swelling. No visible canal hematoma. Disc levels: Degenerative changes in the cervical spine.No high-grade spinal canal stenosis. Upper chest: Not included in the field of view. IMPRESSION: 1. No acute intracranial process. 2. No acute cervical spine fracture or traumatic listhesis. Electronically Signed   By: Wiliam Ke M.D.   On: 03/11/2023 00:09    CT Chest Wo Contrast  Result Date: 03/10/2023 CLINICAL DATA:  Chest trauma EXAM: CT CHEST WITHOUT CONTRAST TECHNIQUE: Multidetector CT imaging of the chest was performed following the standard protocol without IV contrast. RADIATION DOSE REDUCTION: This exam was performed according to the departmental dose-optimization program which includes automated exposure control, adjustment of the mA and/or kV according to  patient size and/or use of iterative reconstruction technique. COMPARISON:  None Available. FINDINGS: Cardiovascular: No significant vascular findings. Normal heart size. No pericardial effusion. There are atherosclerotic calcifications of the aorta. Mediastinum/Nodes: No enlarged mediastinal or axillary lymph nodes. Thyroid gland, trachea, and esophagus demonstrate no significant findings. Lungs/Pleura: There is pleural-parenchymal scarring in the lung apices. There is a 5 mm nodule in the left lower lobe image 4/98. Lungs are otherwise clear. There is no pleural effusion or pneumothorax. Upper Abdomen: No acute abnormality. Musculoskeletal: No chest wall mass or suspicious bone lesions identified. IMPRESSION: 1. No acute cardiopulmonary process. 2. Solid pulmonary nodule measuring 5 mm. Per Fleischner Society Guidelines, no routine follow-up imaging is recommended. These guidelines do not apply to immunocompromised patients and patients with cancer. Follow up in patients with significant comorbidities as clinically warranted. For lung cancer screening, adhere to Lung-RADS guidelines. Reference: Radiology. 2017; 284(1):228-43. Aortic Atherosclerosis (ICD10-I70.0). Electronically Signed   By: Darliss Cheney M.D.   On: 03/10/2023 23:35   DG Knee Complete 4 Views Right  Result Date: 03/10/2023 CLINICAL DATA:  Knee pain after fall EXAM: RIGHT KNEE - COMPLETE 4+ VIEW COMPARISON:  None Available. FINDINGS: No acute fracture or dislocation. No knee joint effusion. Advanced medial compartment narrowing.  Soft tissues are unremarkable. IMPRESSION: No acute fracture or dislocation. Advanced medial compartment narrowing. Electronically Signed   By: Minerva Fester M.D.   On: 03/10/2023 22:30   DG Chest 2 View  Result Date: 03/10/2023 CLINICAL DATA:  Posterior chest pain from fall EXAM: CHEST - 2 VIEW COMPARISON:  Radiograph 03/02/2013 FINDINGS: Stable cardiomediastinal silhouette. Aortic atherosclerotic calcification. No focal consolidation, pleural effusion, or pneumothorax. No displaced rib fractures. IMPRESSION: No acute cardiopulmonary disease. Electronically Signed   By: Minerva Fester M.D.   On: 03/10/2023 22:10    Procedures Procedures    Medications Ordered in ED Medications  fentaNYL (SUBLIMAZE) injection 25 mcg (25 mcg Intravenous Given 03/10/23 1840)  fentaNYL (SUBLIMAZE) injection 50 mcg (50 mcg Intravenous Given 03/10/23 2100)  oxyCODONE-acetaminophen (PERCOCET/ROXICET) 5-325 MG per tablet 1 tablet (1 tablet Oral Given 03/11/23 9604)    ED Course/ Medical Decision Making/ A&P                                 Medical Decision Making Amount and/or Complexity of Data Reviewed Labs: ordered. Radiology: ordered.  Risk OTC drugs. Prescription drug management.   87 year old female presenting for chest pain after mechanical fall.  Patient is alert and oriented x 3, no acute distress, hypertensive at 160/75, with otherwise stable vital signs.  Physical exam demonstrates reproducible sternal chest pain.  X-ray demonstrates no fractures.  No midline spinal pain.  No neurovascular deficits.  Recommended for CT chest ordered.  CT head and neck stable.  Tenderness to palpation of the right knee.  X-ray demonstrates no acute fractures.  Otherwise no bony tenderness on exam.  There is a superficial skin tear of the right elbow.  No bony tenderness.  Patient signed out to oncoming provider while awaiting CT results.         Final Clinical Impression(s) / ED Diagnoses Final  diagnoses:  Fall, initial encounter  Chest pain, unspecified type    Rx / DC Orders ED Discharge Orders          Ordered    oxyCODONE-acetaminophen (PERCOCET) 5-325 MG tablet  Every 6 hours PRN        03/11/23 0045  meloxicam (MOBIC) 15 MG tablet  Daily        03/11/23 0045    Lido-Capsaicin-Men-Methyl Sal (1ST MEDX-PATCH/ LIDOCAINE) 4-0.025-5-20 % PTCH  Daily PRN        03/11/23 0045              Franne Forts, DO 03/12/23 4401

## 2023-03-10 NOTE — ED Notes (Signed)
Patient's family member came to desk stating patient was in severe pain and could not catch her breath. This nurse entered patient's room and found patient to be hyperventilating and holding her abd. This nurse coached patient to slow her breathing. Dr. Leanna Sato notified of patient condition. Dr. Leanna Sato at bedside

## 2023-03-11 MED ORDER — LIDOCAINE 5 % EX PTCH
1.0000 | MEDICATED_PATCH | CUTANEOUS | Status: DC
  Administered 2023-03-11: 1 via TRANSDERMAL
  Filled 2023-03-11: qty 1

## 2023-03-11 MED ORDER — MELOXICAM 15 MG PO TABS
15.0000 mg | ORAL_TABLET | Freq: Once | ORAL | Status: DC
  Filled 2023-03-11: qty 1

## 2023-03-11 MED ORDER — OXYCODONE-ACETAMINOPHEN 5-325 MG PO TABS
1.0000 | ORAL_TABLET | Freq: Once | ORAL | Status: AC
  Administered 2023-03-11: 1 via ORAL
  Filled 2023-03-11: qty 1

## 2023-03-11 MED ORDER — MELOXICAM 15 MG PO TABS: 15.0000 mg | ORAL_TABLET | Freq: Every day | ORAL | 0 refills | Status: DC

## 2023-03-11 MED ORDER — OXYCODONE-ACETAMINOPHEN 5-325 MG PO TABS
1.0000 | ORAL_TABLET | Freq: Four times a day (QID) | ORAL | 0 refills | Status: DC | PRN
Start: 2023-03-11 — End: 2024-04-03

## 2023-03-11 MED ORDER — 1ST MEDX-PATCH/ LIDOCAINE 4-0.025-5-20 % EX PTCH: 1.0000 | MEDICATED_PATCH | Freq: Every day | CUTANEOUS | 0 refills | Status: AC | PRN

## 2023-03-11 NOTE — ED Notes (Signed)
Dr. Clayborne Dana at bedside

## 2023-03-11 NOTE — ED Notes (Signed)
Family informed that results have came back and Dr Clayborne Dana would be in shortly to speak with pt and go over results.

## 2023-03-29 ENCOUNTER — Ambulatory Visit: Payer: Medicare HMO | Admitting: Internal Medicine

## 2023-03-29 ENCOUNTER — Encounter: Payer: Self-pay | Admitting: Internal Medicine

## 2023-03-29 VITALS — BP 120/68 | HR 97 | Ht 67.0 in | Wt 185.8 lb

## 2023-03-29 DIAGNOSIS — M170 Bilateral primary osteoarthritis of knee: Secondary | ICD-10-CM

## 2023-03-29 DIAGNOSIS — R911 Solitary pulmonary nodule: Secondary | ICD-10-CM

## 2023-03-29 DIAGNOSIS — W19XXXD Unspecified fall, subsequent encounter: Secondary | ICD-10-CM | POA: Diagnosis not present

## 2023-03-29 DIAGNOSIS — W19XXXA Unspecified fall, initial encounter: Secondary | ICD-10-CM | POA: Insufficient documentation

## 2023-03-29 DIAGNOSIS — Z09 Encounter for follow-up examination after completed treatment for conditions other than malignant neoplasm: Secondary | ICD-10-CM

## 2023-03-29 NOTE — Assessment & Plan Note (Signed)
Appears to be mechanical fall, likely due to leg weakness from OA of knee Advised to use cane for walking support to avoid fall

## 2023-03-29 NOTE — Assessment & Plan Note (Signed)
ER chart reviewed, including imaging Although ER chart mentions chest pain and dyspnea, she did not have the symptoms before the fall -she reports chest soreness after the fall, which has resolved now

## 2023-03-29 NOTE — Patient Instructions (Signed)
Please take Tylenol arthritis as needed for knee pain.  Please maintain adequate hydration and eat at regular intervals.

## 2023-03-29 NOTE — Assessment & Plan Note (Addendum)
Incidental 5 mm solid pulmonary nodule - no imaging follow-up needed as she is low risk candidate (no smoking history)

## 2023-03-29 NOTE — Assessment & Plan Note (Addendum)
Her bilateral knee pain is likely due to OA of knee Advised to take Tylenol arthritis as needed for pain Avoid oral NSAIDs due to her history of GERD/dysphagia and cerebral aneurysm Can use Biofreeze or Voltaren gel as needed Rest, ice and leg elevation as tolerated If persistent pain, will refer to orthopedic surgery

## 2023-03-29 NOTE — Progress Notes (Signed)
Established Patient Office Visit  Subjective:  Patient ID: Teresa Simon, female    DOB: 27-Nov-1935  Age: 87 y.o. MRN: [id number]  CC:  Chief Complaint  Patient presents with   Genia Hotter on 03/04/2023, having pain in right knee    HPI Teresa Simon is a 87 y.o. female with past medical history of HTN, cerebral aneurysm s/p clip, TIA and chronic back pain who presents for f/u of recent ER visit.  She had a mechanical fall at home on 03/10/23, in her driveway.  She was trying to get her groceries into her home while walking uphill on her driveway.  She lost some groceries from her hand and was trying to stop it from rolling on the driveway, lost her balance due to her legs giving out and fell backwards.  She hit her hips, back and head.  She went to ER, where her CT of head and cervical spine were negative for acute injury.  She was also having chest soreness, had CT chest done, which did not show any rib fracture.  Today, she denies any headache, dizziness or visual disturbance.  She has right > left knee pain and leg pain.  X-ray of right knee showed medial compartment narrowing.  She has been using OTC topicals for it without much relief.  Denies any numbness or tingling of the LE.  Past Medical History:  Diagnosis Date   Aneurysm (HCC)    Hypercholesteremia     Past Surgical History:  Procedure Laterality Date   CEREBRAL ANEURYSM REPAIR      Family History  Problem Relation Age of Onset   Obesity Sister    Cancer Sister        breast   Kidney disease Sister        dialysis   COPD Sister    Stroke Brother    Diabetes Brother    Heart disease Brother    Thyroid disease Daughter    Arthritis Daughter     Social History   Socioeconomic History   Marital status: Married    Spouse name: Fayrene Fearing   Number of children: 2   Years of education: 12   Highest education level: High school graduate  Occupational History   Not on file  Tobacco Use   Smoking status:  Never   Smokeless tobacco: Never  Vaping Use   Vaping status: Never Used  Substance and Sexual Activity   Alcohol use: No   Drug use: No   Sexual activity: Yes    Birth control/protection: Other-see comments  Other Topics Concern   Not on file  Social History Narrative   Not on file   Social Determinants of Health   Financial Resource Strain: Low Risk  (06/29/2022)   Overall Financial Resource Strain (CARDIA)    Difficulty of Paying Living Expenses: Not hard at all  Food Insecurity: No Food Insecurity (06/29/2022)   Hunger Vital Sign    Worried About Running Out of Food in the Last Year: Never true    Ran Out of Food in the Last Year: Never true  Transportation Needs: No Transportation Needs (06/29/2022)   PRAPARE - Administrator, Civil Service (Medical): No    Lack of Transportation (Non-Medical): No  Physical Activity: Sufficiently Active (06/29/2022)   Exercise Vital Sign    Days of Exercise per Week: 5 days    Minutes of Exercise per Session: 30 min  Stress: No Stress Concern Present (06/29/2022)  [employer] of Occupational Health - Occupational Stress Questionnaire    Feeling of Stress : Not at all  Social Connections: Moderately Integrated (06/29/2022)   Social Connection and Isolation Panel [NHANES]    Frequency of Communication with Friends and Family: More than three times a week    Frequency of Social Gatherings with Friends and Family: Three times a week    Attends Religious Services: More than 4 times per year    Active Member of Clubs or Organizations: No    Attends Banker Meetings: Never    Marital Status: Married  Catering manager Violence: Not At Risk (06/29/2022)   Humiliation, Afraid, Rape, and Kick questionnaire    Fear of Current or Ex-Partner: No    Emotionally Abused: No    Physically Abused: No    Sexually Abused: No    Outpatient Medications Prior to Visit  Medication Sig Dispense Refill   acetaminophen (TYLENOL) 500 MG  tablet Take 500 mg by mouth every 6 (six) hours as needed for mild pain (pain score 1-3).     amLODipine (NORVASC) 5 MG tablet Take 1 tablet (5 mg total) by mouth daily. 90 tablet 1   Coenzyme Q10 100 MG capsule Take 100 mg by mouth daily.     mirabegron ER (MYRBETRIQ) 50 MG TB24 tablet Take 1 tablet (50 mg total) by mouth daily. 30 tablet 5   Misc Natural Products (OSTEO BI-FLEX ADV JOINT SHIELD PO) Take 2 capsules by mouth daily.     oxyCODONE-acetaminophen (PERCOCET) 5-325 MG tablet Take 1 tablet by mouth every 6 (six) hours as needed for severe pain (pain score 7-10). 10 tablet 0   simvastatin (ZOCOR) 20 MG tablet Take 1 tablet (20 mg total) by mouth at bedtime. 90 tablet 3   Specialty Vitamins Products (MM BIOTIN/KERATIN) CAPS Take 2 capsules by mouth daily.     VITAMIN E PO Take 1 tablet by mouth daily.     meloxicam (MOBIC) 15 MG tablet Take 1 tablet (15 mg total) by mouth daily. Do NOT take with motrin, ibuprofen, aleve, aspirin or other NSAID's medications 10 tablet 0   No facility-administered medications prior to visit.    Allergies  Allergen Reactions   Naproxen Diarrhea   Niacin And Related Other (See Comments)    Redness, burning sensation    ROS Review of Systems  Constitutional:  Negative for chills and fever.  HENT:  Negative for congestion, sinus pressure and sinus pain.   Eyes:  Negative for pain and discharge.  Respiratory:  Negative for cough and shortness of breath.   Cardiovascular:  Negative for chest pain and palpitations.  Gastrointestinal:  Negative for abdominal pain, diarrhea, nausea and vomiting.  Endocrine: Negative for polydipsia and polyuria.  Genitourinary:  Negative for dysuria, flank pain and hematuria.  Musculoskeletal:  Positive for arthralgias (Bilateral knee pain) and back pain (Chronic). Negative for neck pain and neck stiffness.  Skin:  Negative for rash.  Neurological:  Negative for dizziness and weakness.  Psychiatric/Behavioral:  Negative  for agitation and behavioral problems.       Objective:    Physical Exam Vitals reviewed.  Constitutional:      General: She is not in acute distress.    Appearance: She is not diaphoretic.  HENT:     Head: Normocephalic.     Nose: Nose normal. No congestion.     Mouth/Throat:     Mouth: Mucous membranes are moist.     Pharynx: No posterior oropharyngeal  erythema.  Eyes:     General: No scleral icterus.    Extraocular Movements: Extraocular movements intact.  Cardiovascular:     Rate and Rhythm: Normal rate and regular rhythm.     Pulses: Normal pulses.     Heart sounds: Normal heart sounds. No murmur heard. Pulmonary:     Breath sounds: Normal breath sounds. No wheezing or rales.  Musculoskeletal:     Cervical back: Neck supple. No tenderness.     Lumbar back: No swelling or bony tenderness.     Right knee: Swelling present. Normal range of motion.     Left knee: Swelling present. Normal range of motion.     Right lower leg: No edema.     Left lower leg: No edema.  Feet:     Left foot:     Toenail Condition: Left toenails are abnormally thick.  Skin:    General: Skin is warm.     Findings: No rash.  Neurological:     General: No focal deficit present.     Mental Status: She is alert and oriented to person, place, and time.     Sensory: No sensory deficit.     Motor: No weakness.  Psychiatric:        Mood and Affect: Mood normal.        Behavior: Behavior normal.     BP 120/68 (BP Location: Left Arm)   Pulse 97   Ht 5\' 7"  (1.702 m)   Wt 185 lb 12.8 oz (84.3 kg)   SpO2 95%   BMI 29.10 kg/m  Wt Readings from Last 3 Encounters:  03/29/23 185 lb 12.8 oz (84.3 kg)  03/10/23 178 lb 5.6 oz (80.9 kg)  12/02/22 178 lb 6.4 oz (80.9 kg)    Lab Results  Component Value Date   TSH 1.250 05/07/2022   Lab Results  Component Value Date   WBC 7.5 03/10/2023   HGB 14.4 03/10/2023   HCT 43.2 03/10/2023   MCV 92.9 03/10/2023   PLT 200 03/10/2023   Lab Results   Component Value Date   NA 139 03/10/2023   K 4.5 03/10/2023   CO2 27 03/10/2023   GLUCOSE 111 (H) 03/10/2023   BUN 16 03/10/2023   CREATININE 0.93 03/10/2023   BILITOT 0.5 12/02/2022   ALKPHOS 48 12/02/2022   AST 29 12/02/2022   ALT 30 12/02/2022   PROT 6.9 12/02/2022   ALBUMIN 4.7 12/02/2022   CALCIUM 9.4 03/10/2023   ANIONGAP 11 03/10/2023   EGFR 63 12/02/2022   Lab Results  Component Value Date   CHOL 153 05/07/2022   Lab Results  Component Value Date   HDL 69 05/07/2022   Lab Results  Component Value Date   LDLCALC 63 05/07/2022   Lab Results  Component Value Date   TRIG 118 05/07/2022   Lab Results  Component Value Date   CHOLHDL 2.2 05/07/2022   Lab Results  Component Value Date   HGBA1C 6.3 (H) 12/02/2022      Assessment & Plan:   Problem List Items Addressed This Visit       Respiratory   Pulmonary nodule    Incidental 5 mm solid pulmonary nodule - no imaging follow-up needed as she is low risk candidate (no smoking history)        Musculoskeletal and Integument   Primary osteoarthritis of both knees    Her bilateral knee pain is likely due to OA of knee Advised to take Tylenol arthritis as  needed for pain Avoid oral NSAIDs due to her history of GERD/dysphagia and cerebral aneurysm Can use Biofreeze or Voltaren gel as needed Rest, ice and leg elevation as tolerated If persistent pain, will refer to orthopedic surgery        Other   Encounter for examination following treatment at hospital - Primary    ER chart reviewed, including imaging Although ER chart mentions chest pain and dyspnea, she did not have the symptoms before the fall -she reports chest soreness after the fall, which has resolved now      Fall    Appears to be mechanical fall, likely due to leg weakness from OA of knee Advised to use cane for walking support to avoid fall       No orders of the defined types were placed in this encounter.   Follow-up: Return if  symptoms worsen or fail to improve.    Anabel Halon, MD

## 2023-05-04 ENCOUNTER — Encounter: Payer: Medicare HMO | Admitting: Internal Medicine

## 2023-05-20 ENCOUNTER — Encounter: Payer: Self-pay | Admitting: Internal Medicine

## 2023-05-20 ENCOUNTER — Ambulatory Visit (INDEPENDENT_AMBULATORY_CARE_PROVIDER_SITE_OTHER): Payer: Medicare HMO | Admitting: Internal Medicine

## 2023-05-20 VITALS — BP 137/67 | HR 83 | Ht 67.0 in | Wt 183.6 lb

## 2023-05-20 DIAGNOSIS — Z0001 Encounter for general adult medical examination with abnormal findings: Secondary | ICD-10-CM

## 2023-05-20 DIAGNOSIS — I10 Essential (primary) hypertension: Secondary | ICD-10-CM

## 2023-05-20 DIAGNOSIS — R7303 Prediabetes: Secondary | ICD-10-CM

## 2023-05-20 DIAGNOSIS — E559 Vitamin D deficiency, unspecified: Secondary | ICD-10-CM

## 2023-05-20 DIAGNOSIS — N3946 Mixed incontinence: Secondary | ICD-10-CM | POA: Diagnosis not present

## 2023-05-20 DIAGNOSIS — E785 Hyperlipidemia, unspecified: Secondary | ICD-10-CM | POA: Diagnosis not present

## 2023-05-20 DIAGNOSIS — E782 Mixed hyperlipidemia: Secondary | ICD-10-CM | POA: Diagnosis not present

## 2023-05-20 MED ORDER — SIMVASTATIN 20 MG PO TABS
20.0000 mg | ORAL_TABLET | Freq: Every day | ORAL | 3 refills | Status: DC
Start: 2023-05-20 — End: 2024-03-21

## 2023-05-20 MED ORDER — AMLODIPINE BESYLATE 5 MG PO TABS
5.0000 mg | ORAL_TABLET | Freq: Every day | ORAL | 3 refills | Status: DC
Start: 2023-05-20 — End: 2023-11-18

## 2023-05-20 MED ORDER — MIRABEGRON ER 50 MG PO TB24
50.0000 mg | ORAL_TABLET | Freq: Every day | ORAL | 11 refills | Status: DC
Start: 2023-05-20 — End: 2024-03-26

## 2023-05-20 NOTE — Assessment & Plan Note (Signed)
Physical exam as documented. Fasting blood tests today. Has had flu and COVID booster at local pharmacy.

## 2023-05-20 NOTE — Assessment & Plan Note (Signed)
Lab Results  Component Value Date   HGBA1C 6.3 (H) 12/02/2022   Advised to follow DASH diet

## 2023-05-20 NOTE — Assessment & Plan Note (Signed)
BP Readings from Last 1 Encounters:  05/20/23 137/67   Well-controlled Although would be acceptable at her age, but due to her history of cerebral aneurysm, continue amlodipine 5 mg QD for now Would avoid diuretic medicine as she is already struggling with urge incontinence and nocturia Counseled for compliance with the medications Advised DASH diet and moderate exercise/walking as tolerated

## 2023-05-20 NOTE — Assessment & Plan Note (Addendum)
Well-controlled On Myrbetriq Had dry mouth with Oxybutynin

## 2023-05-20 NOTE — Progress Notes (Signed)
Established Patient Office Visit  Subjective:  Patient ID: Teresa Simon, female    DOB: Oct 10, 1935  Age: 87 y.o. MRN: [id number]  CC:  Chief Complaint  Patient presents with   Annual Exam    HPI Teresa Simon is a 87 y.o. female with past medical history of cerebral aneurysm s/p clip, TIA and chronic back pain who presents for annual physical.  Her BP was wnl today. She has had intermittent high BP in the past.  She denies any headache, dizziness, chest pain, dyspnea or palpitations currently.  She has a episodes of headache.  She has history of cerebral aneurysm repair.  Her chronic low back pain has resolved now as she has stopped working.  She has chronic hip and knee pain, for which she uses topical agents.   She takes simvastatin for HLD.  She takes Myrbetriq for urinary urgency, which is improved now. She had dry mouth from oxybutynin. She denies any dysuria or hematuria currently.    Past Medical History:  Diagnosis Date   Aneurysm (HCC)    Hypercholesteremia     Past Surgical History:  Procedure Laterality Date   CEREBRAL ANEURYSM REPAIR      Family History  Problem Relation Age of Onset   Obesity Sister    Cancer Sister        breast   Kidney disease Sister        dialysis   COPD Sister    Stroke Brother    Diabetes Brother    Heart disease Brother    Thyroid disease Daughter    Arthritis Daughter     Social History   Socioeconomic History   Marital status: Married    Spouse name: Teresa Simon   Number of children: 2   Years of education: 12   Highest education level: High school graduate  Occupational History   Not on file  Tobacco Use   Smoking status: Never   Smokeless tobacco: Never  Vaping Use   Vaping status: Never Used  Substance and Sexual Activity   Alcohol use: No   Drug use: No   Sexual activity: Yes    Birth control/protection: Other-see comments  Other Topics Concern   Not on file  Social History Narrative   Not on file    Social Drivers of Health   Financial Resource Strain: Low Risk  (06/29/2022)   Overall Financial Resource Strain (CARDIA)    Difficulty of Paying Living Expenses: Not hard at all  Food Insecurity: No Food Insecurity (06/29/2022)   Hunger Vital Sign    Worried About Running Out of Food in the Last Year: Never true    Ran Out of Food in the Last Year: Never true  Transportation Needs: No Transportation Needs (06/29/2022)   PRAPARE - Administrator, Civil Service (Medical): No    Lack of Transportation (Non-Medical): No  Physical Activity: Sufficiently Active (06/29/2022)   Exercise Vital Sign    Days of Exercise per Week: 5 days    Minutes of Exercise per Session: 30 min  Stress: No Stress Concern Present (06/29/2022)   [employer] of Occupational Health - Occupational Stress Questionnaire    Feeling of Stress : Not at all  Social Connections: Moderately Integrated (06/29/2022)   Social Connection and Isolation Panel [NHANES]    Frequency of Communication with Friends and Family: More than three times a week    Frequency of Social Gatherings with Friends and Family: Three times a week  Attends Religious Services: More than 4 times per year    Active Member of Clubs or Organizations: No    Attends Banker Meetings: Never    Marital Status: Married  Catering manager Violence: Not At Risk (06/29/2022)   Humiliation, Afraid, Rape, and Kick questionnaire    Fear of Current or Ex-Partner: No    Emotionally Abused: No    Physically Abused: No    Sexually Abused: No    Outpatient Medications Prior to Visit  Medication Sig Dispense Refill   acetaminophen (TYLENOL) 500 MG tablet Take 500 mg by mouth every 6 (six) hours as needed for mild pain (pain score 1-3).     Coenzyme Q10 100 MG capsule Take 100 mg by mouth daily.     Misc Natural Products (OSTEO BI-FLEX ADV JOINT SHIELD PO) Take 2 capsules by mouth daily.     oxyCODONE-acetaminophen (PERCOCET) 5-325 MG tablet  Take 1 tablet by mouth every 6 (six) hours as needed for severe pain (pain score 7-10). 10 tablet 0   Specialty Vitamins Products (MM BIOTIN/KERATIN) CAPS Take 2 capsules by mouth daily.     VITAMIN E PO Take 1 tablet by mouth daily.     amLODipine (NORVASC) 5 MG tablet Take 1 tablet (5 mg total) by mouth daily. 90 tablet 1   mirabegron ER (MYRBETRIQ) 50 MG TB24 tablet Take 1 tablet (50 mg total) by mouth daily. 30 tablet 5   simvastatin (ZOCOR) 20 MG tablet Take 1 tablet (20 mg total) by mouth at bedtime. 90 tablet 3   No facility-administered medications prior to visit.    Allergies  Allergen Reactions   Naproxen Diarrhea   Niacin And Related Other (See Comments)    Redness, burning sensation    ROS Review of Systems  Constitutional:  Negative for chills and fever.  HENT:  Negative for congestion, sinus pressure, sinus pain and sore throat.   Eyes:  Negative for pain and discharge.  Respiratory:  Negative for cough and shortness of breath.   Cardiovascular:  Negative for chest pain and palpitations.  Gastrointestinal:  Negative for abdominal pain, diarrhea, nausea and vomiting.  Endocrine: Negative for polydipsia and polyuria.  Genitourinary:  Negative for dysuria, flank pain and hematuria.  Musculoskeletal:  Positive for back pain (Chronic). Negative for neck pain and neck stiffness.  Skin:  Negative for rash.  Neurological:  Negative for dizziness and weakness.  Psychiatric/Behavioral:  Negative for agitation and behavioral problems.       Objective:    Physical Exam Vitals reviewed.  Constitutional:      General: She is not in acute distress.    Appearance: She is not diaphoretic.  HENT:     Head: Normocephalic and atraumatic.     Nose: Nose normal. No congestion.     Mouth/Throat:     Mouth: Mucous membranes are moist.     Pharynx: No posterior oropharyngeal erythema.  Eyes:     General: No scleral icterus.    Extraocular Movements: Extraocular movements intact.   Cardiovascular:     Rate and Rhythm: Normal rate and regular rhythm.     Pulses: Normal pulses.     Heart sounds: Normal heart sounds. No murmur heard. Pulmonary:     Breath sounds: Normal breath sounds. No wheezing or rales.  Abdominal:     Palpations: Abdomen is soft.     Tenderness: There is no abdominal tenderness.  Musculoskeletal:     Cervical back: Neck supple. No tenderness.  Lumbar back: No swelling or bony tenderness.     Right lower leg: No edema.     Left lower leg: No edema.  Skin:    General: Skin is warm.     Findings: No rash.  Neurological:     General: No focal deficit present.     Mental Status: She is alert and oriented to person, place, and time.     Sensory: No sensory deficit.     Motor: No weakness.  Psychiatric:        Mood and Affect: Mood normal.        Behavior: Behavior normal.     BP 137/67 (BP Location: Left Arm, Patient Position: Sitting, Cuff Size: Normal)   Pulse 83   Ht 5\' 7"  (1.702 m)   Wt 183 lb 9.6 oz (83.3 kg)   SpO2 91%   BMI 28.76 kg/m  Wt Readings from Last 3 Encounters:  05/20/23 183 lb 9.6 oz (83.3 kg)  03/29/23 185 lb 12.8 oz (84.3 kg)  03/10/23 178 lb 5.6 oz (80.9 kg)    Lab Results  Component Value Date   TSH 1.250 05/07/2022   Lab Results  Component Value Date   WBC 7.5 03/10/2023   HGB 14.4 03/10/2023   HCT 43.2 03/10/2023   MCV 92.9 03/10/2023   PLT 200 03/10/2023   Lab Results  Component Value Date   NA 139 03/10/2023   K 4.5 03/10/2023   CO2 27 03/10/2023   GLUCOSE 111 (H) 03/10/2023   BUN 16 03/10/2023   CREATININE 0.93 03/10/2023   BILITOT 0.5 12/02/2022   ALKPHOS 48 12/02/2022   AST 29 12/02/2022   ALT 30 12/02/2022   PROT 6.9 12/02/2022   ALBUMIN 4.7 12/02/2022   CALCIUM 9.4 03/10/2023   ANIONGAP 11 03/10/2023   EGFR 63 12/02/2022   Lab Results  Component Value Date   CHOL 153 05/07/2022   Lab Results  Component Value Date   HDL 69 05/07/2022   Lab Results  Component Value Date    LDLCALC 63 05/07/2022   Lab Results  Component Value Date   TRIG 118 05/07/2022   Lab Results  Component Value Date   CHOLHDL 2.2 05/07/2022   Lab Results  Component Value Date   HGBA1C 6.3 (H) 12/02/2022      Assessment & Plan:   Problem List Items Addressed This Visit       Cardiovascular and Mediastinum   Essential hypertension   BP Readings from Last 1 Encounters:  05/20/23 137/67   Well-controlled Although would be acceptable at her age, but due to her history of cerebral aneurysm, continue amlodipine 5 mg QD for now Would avoid diuretic medicine as she is already struggling with urge incontinence and nocturia Counseled for compliance with the medications Advised DASH diet and moderate exercise/walking as tolerated      Relevant Medications   amLODipine (NORVASC) 5 MG tablet   simvastatin (ZOCOR) 20 MG tablet   Other Relevant Orders   TSH   CMP14+EGFR   CBC with Differential/Platelet     Other   Hyperlipidemia   Well controlled, on Simvastatin currently      Relevant Medications   amLODipine (NORVASC) 5 MG tablet   simvastatin (ZOCOR) 20 MG tablet   Other Relevant Orders   Lipid panel   Encounter for general adult medical examination with abnormal findings - Primary   Physical exam as documented. Fasting blood tests today. Has had flu and COVID booster at local  pharmacy.      Mixed stress and urge urinary incontinence   Well-controlled On Myrbetriq Had dry mouth with Oxybutynin      Relevant Medications   mirabegron ER (MYRBETRIQ) 50 MG TB24 tablet   Other Relevant Orders   CMP14+EGFR   CBC with Differential/Platelet   Prediabetes   Lab Results  Component Value Date   HGBA1C 6.3 (H) 12/02/2022   Advised to follow DASH diet      Relevant Orders   Hemoglobin A1c   CMP14+EGFR   Other Visit Diagnoses       Vitamin D deficiency       Relevant Orders   VITAMIN D 25 Hydroxy (Vit-D Deficiency, Fractures)       Meds ordered this  encounter  Medications   amLODipine (NORVASC) 5 MG tablet    Sig: Take 1 tablet (5 mg total) by mouth daily.    Dispense:  90 tablet    Refill:  3   mirabegron ER (MYRBETRIQ) 50 MG TB24 tablet    Sig: Take 1 tablet (50 mg total) by mouth daily.    Dispense:  30 tablet    Refill:  11   simvastatin (ZOCOR) 20 MG tablet    Sig: Take 1 tablet (20 mg total) by mouth at bedtime.    Dispense:  90 tablet    Refill:  3    Follow-up: Return in about 6 months (around 11/18/2023) for HTN and HLD.    Anabel Halon, MD

## 2023-05-20 NOTE — Patient Instructions (Signed)
Please continue to take medications as prescribed.  Please continue to follow low carb diet and ambulate as tolerated. 

## 2023-05-20 NOTE — Assessment & Plan Note (Signed)
Well controlled, on Simvastatin currently 

## 2023-05-21 LAB — CBC WITH DIFFERENTIAL/PLATELET
Basophils Absolute: 0 10*3/uL (ref 0.0–0.2)
Basos: 1 %
EOS (ABSOLUTE): 0.2 10*3/uL (ref 0.0–0.4)
Eos: 2 %
Hematocrit: 39.6 % (ref 34.0–46.6)
Hemoglobin: 13.3 g/dL (ref 11.1–15.9)
Immature Grans (Abs): 0 10*3/uL (ref 0.0–0.1)
Immature Granulocytes: 0 %
Lymphocytes Absolute: 1.6 10*3/uL (ref 0.7–3.1)
Lymphs: 24 %
MCH: 31.1 pg (ref 26.6–33.0)
MCHC: 33.6 g/dL (ref 31.5–35.7)
MCV: 93 fL (ref 79–97)
Monocytes Absolute: 0.9 10*3/uL (ref 0.1–0.9)
Monocytes: 12 %
Neutrophils Absolute: 4.3 10*3/uL (ref 1.4–7.0)
Neutrophils: 61 %
Platelets: 202 10*3/uL (ref 150–450)
RBC: 4.28 x10E6/uL (ref 3.77–5.28)
RDW: 12.6 % (ref 11.7–15.4)
WBC: 7 10*3/uL (ref 3.4–10.8)

## 2023-05-21 LAB — LIPID PANEL
Chol/HDL Ratio: 2.3 {ratio} (ref 0.0–4.4)
Cholesterol, Total: 154 mg/dL (ref 100–199)
HDL: 68 mg/dL (ref >39)
LDL Chol Calc (NIH): 67 mg/dL (ref 0–99)
Triglycerides: 104 mg/dL (ref 0–149)
VLDL Cholesterol Cal: 19 mg/dL (ref 5–40)

## 2023-05-21 LAB — CMP14+EGFR
ALT: 23 [IU]/L (ref 0–32)
AST: 28 [IU]/L (ref 0–40)
Albumin: 4.3 g/dL (ref 3.7–4.7)
Alkaline Phosphatase: 50 [IU]/L (ref 44–121)
BUN/Creatinine Ratio: 15 (ref 12–28)
BUN: 12 mg/dL (ref 8–27)
Bilirubin Total: 0.5 mg/dL (ref 0.0–1.2)
CO2: 23 mmol/L (ref 20–29)
Calcium: 9.6 mg/dL (ref 8.7–10.3)
Chloride: 107 mmol/L — ABNORMAL HIGH (ref 96–106)
Creatinine, Ser: 0.81 mg/dL (ref 0.57–1.00)
Globulin, Total: 2.1 g/dL (ref 1.5–4.5)
Glucose: 101 mg/dL — ABNORMAL HIGH (ref 70–99)
Potassium: 4.5 mmol/L (ref 3.5–5.2)
Sodium: 144 mmol/L (ref 134–144)
Total Protein: 6.4 g/dL (ref 6.0–8.5)
eGFR: 71 mL/min/{1.73_m2} (ref >59)

## 2023-05-21 LAB — TSH: TSH: 0.675 u[IU]/mL (ref 0.450–4.500)

## 2023-05-21 LAB — HEMOGLOBIN A1C
Est. average glucose Bld gHb Est-mCnc: 134 mg/dL
Hgb A1c MFr Bld: 6.3 % — ABNORMAL HIGH (ref 4.8–5.6)

## 2023-05-21 LAB — VITAMIN D 25 HYDROXY (VIT D DEFICIENCY, FRACTURES): Vit D, 25-Hydroxy: 31.1 ng/mL (ref 30.0–100.0)

## 2023-06-01 DIAGNOSIS — R102 Pelvic and perineal pain: Secondary | ICD-10-CM | POA: Diagnosis not present

## 2023-06-01 DIAGNOSIS — Z1231 Encounter for screening mammogram for malignant neoplasm of breast: Secondary | ICD-10-CM | POA: Diagnosis not present

## 2023-06-01 DIAGNOSIS — Z01419 Encounter for gynecological examination (general) (routine) without abnormal findings: Secondary | ICD-10-CM | POA: Diagnosis not present

## 2023-06-02 DIAGNOSIS — R102 Pelvic and perineal pain: Secondary | ICD-10-CM | POA: Diagnosis not present

## 2023-07-05 ENCOUNTER — Ambulatory Visit (INDEPENDENT_AMBULATORY_CARE_PROVIDER_SITE_OTHER): Payer: Medicare HMO

## 2023-07-05 VITALS — Ht 67.0 in | Wt 174.0 lb

## 2023-07-05 DIAGNOSIS — Z0000 Encounter for general adult medical examination without abnormal findings: Secondary | ICD-10-CM | POA: Diagnosis not present

## 2023-07-05 NOTE — Patient Instructions (Signed)
 [name] , Thank you for taking time to come for your Medicare Wellness Visit. I appreciate your ongoing commitment to your health goals. Please review the following plan we discussed and let me know if I can assist you in the future.   Referrals/Orders/Follow-Ups/Clinician Recommendations:   Next Medicare Annual Wellness Visit:July 05, 2024 at 8:00 am telephone visit.   This is a list of the screening recommended for you and due dates:  Health Maintenance  Topic Date Due   COVID-19 Vaccine (5 - 2024-25 season) 01/24/2023   DTaP/Tdap/Td vaccine (2 - Td or Tdap) 03/03/2023   Medicare Annual Wellness Visit  07/04/2024   Pneumonia Vaccine  Completed   Flu Shot  Completed   DEXA scan (bone density measurement)  Completed   Zoster (Shingles) Vaccine  Completed   HPV Vaccine  Aged Out    Advanced directives: (Declined) Advance directive discussed with you today. Even though you declined this today, please call our office should you change your mind, and we can give you the proper paperwork for you to fill out.  Next Medicare Annual Wellness Visit scheduled for next year: yes  Preventive Care 70 Years and Older, Female Preventive care refers to lifestyle choices and visits with your health care provider that can promote health and wellness. Preventive care visits are also called wellness exams. What can I expect for my preventive care visit? Counseling Your health care provider may ask you questions about your: Medical history, including: Past medical problems. Family medical history. Pregnancy and menstrual history. History of falls. Current health, including: Memory and ability to understand (cognition). Emotional well-being. Home life and relationship well-being. Sexual activity and sexual health. Lifestyle, including: Alcohol, nicotine or tobacco, and drug use. Access to firearms. Diet, exercise, and sleep habits. Work and work Astronomer. Sunscreen use. Safety  issues such as seatbelt and bike helmet use. Physical exam Your health care provider will check your: Height and weight. These may be used to calculate your BMI (body mass index). BMI is a measurement that tells if you are at a healthy weight. Waist circumference. This measures the distance around your waistline. This measurement also tells if you are at a healthy weight and may help predict your risk of certain diseases, such as type 2 diabetes and high blood pressure. Heart rate and blood pressure. Body temperature. Skin for abnormal spots. What immunizations do I need?  Vaccines are usually given at various ages, according to a schedule. Your health care provider will recommend vaccines for you based on your age, medical history, and lifestyle or other factors, such as travel or where you work. What tests do I need? Screening Your health care provider may recommend screening tests for certain conditions. This may include: Lipid and cholesterol levels. Hepatitis C test. Hepatitis B test. HIV (human immunodeficiency virus) test. STI (sexually transmitted infection) testing, if you are at risk. Lung cancer screening. Colorectal cancer screening. Diabetes screening. This is done by checking your blood sugar (glucose) after you have not eaten for a while (fasting). Mammogram. Talk with your health care provider about how often you should have regular mammograms. BRCA-related cancer screening. This may be done if you have a family history of breast, ovarian, tubal, or peritoneal cancers. Bone density scan. This is done to screen for osteoporosis. Talk with your health care provider about your test results, treatment options, and if necessary, the need for more tests. Follow these instructions at home: Eating and drinking  Eat a diet that includes  fresh fruits and vegetables, whole grains, lean protein, and low-fat dairy products. Limit your intake of foods with high amounts of sugar,  saturated fats, and salt. Take vitamin and mineral supplements as recommended by your health care provider. Do not drink alcohol if your health care provider tells you not to drink. If you drink alcohol: Limit how much you have to 0-1 drink a day. Know how much alcohol is in your drink. In the [place] one drink equals one 12 oz bottle of beer (355 mL), one 5 oz glass of wine (148 mL), or one 1 oz glass of hard liquor (44 mL). Lifestyle Brush your teeth every morning and night with fluoride toothpaste. Floss one time each day. Exercise for at least 30 minutes 5 or more days each week. Do not use any products that contain nicotine or tobacco. These products include cigarettes, chewing tobacco, and vaping devices, such as e-cigarettes. If you need help quitting, ask your health care provider. Do not use drugs. If you are sexually active, practice safe sex. Use a condom or other form of protection in order to prevent STIs. Take aspirin  only as told by your health care provider. Make sure that you understand how much to take and what form to take. Work with your health care provider to find out whether it is safe and beneficial for you to take aspirin  daily. Ask your health care provider if you need to take a cholesterol-lowering medicine (statin). Find healthy ways to manage stress, such as: Meditation, yoga, or listening to music. Journaling. Talking to a trusted person. Spending time with friends and family. Minimize exposure to UV radiation to reduce your risk of skin cancer. Safety Always wear your seat belt while driving or riding in a vehicle. Do not drive: If you have been drinking alcohol. Do not ride with someone who has been drinking. When you are tired or distracted. While texting. If you have been using any mind-altering substances or drugs. Wear a helmet and other protective equipment during sports activities. If you have firearms in your house, make sure you follow all gun safety  procedures. What's next? Visit your health care provider once a year for an annual wellness visit. Ask your health care provider how often you should have your eyes and teeth checked. Stay up to date on all vaccines. This information is not intended to replace advice given to you by your health care provider. Make sure you discuss any questions you have with your health care provider. Document Revised: 11/06/2020 Document Reviewed: 11/06/2020 Elsevier Patient Education  2024 [employer]. Understanding Your Risk for Falls Millions of people have serious injuries from falls each year. It is important to understand your risk of falling. Talk with your health care provider about your risk and what you can do to lower it. If you do have a serious fall, make sure to tell your provider. Falling once raises your risk of falling again. How can falls affect me? Serious injuries from falls are common. These include: Broken bones, such as hip fractures. Head injuries, such as traumatic brain injuries (TBI) or concussions. A fear of falling can cause you to avoid activities and stay at home. This can make your muscles weaker and raise your risk for a fall. What can increase my risk? There are a number of risk factors that increase your risk for falling. The more risk factors you have, the higher your risk of falling. Serious injuries from a fall happen most often to  people who are older than 86 years old. Teenagers and young adults ages 74-29 are also at higher risk. Common risk factors include: Weakness in the lower body. Being generally weak or confused due to long-term (chronic) illness. Dizziness or balance problems. Poor vision. Medicines that cause dizziness or drowsiness. These may include: Medicines for your blood pressure, heart, anxiety, insomnia, or swelling (edema). Pain medicines. Muscle relaxants. Other risk factors include: Drinking alcohol. Having had a fall in the past. Having  foot pain or wearing improper footwear. Working at a dangerous job. Having any of the following in your home: Tripping hazards, such as floor clutter or loose rugs. Poor lighting. Pets. Having dementia or memory loss. What actions can I take to lower my risk of falling?     Physical activity Stay physically fit. Do strength and balance exercises. Consider taking a regular class to build strength and balance. Yoga and tai chi are good options. Vision Have your eyes checked every year and your prescription for glasses or contacts updated as needed. Shoes and walking aids Wear non-skid shoes. Wear shoes that have rubber soles and low heels. Do not wear high heels. Do not walk around the house in socks or slippers. Use a cane or walker as told by your provider. Home safety Attach secure railings on both sides of your stairs. Install grab bars for your bathtub, shower, and toilet. Use a non-skid mat in your bathtub or shower. Attach bath mats securely with double-sided, non-slip rug tape. Use good lighting in all rooms. Keep a flashlight near your bed. Make sure there is a clear path from your bed to the bathroom. Use night-lights. Do not use throw rugs. Make sure all carpeting is taped or tacked down securely. Remove all clutter from walkways and stairways, including extension cords. Repair uneven or broken steps and floors. Avoid walking on icy or slippery surfaces. Walk on the grass instead of on icy or slick sidewalks. Use ice melter to get rid of ice on walkways in the winter. Use a cordless phone. Questions to ask your health care provider Can you help me check my risk for a fall? Do any of my medicines make me more likely to fall? Should I take a vitamin D  supplement? What exercises can I do to improve my strength and balance? Should I make an appointment to have my vision checked? Do I need a bone density test to check for weak bones (osteoporosis)? Would it help to use a  cane or a walker? Where to find more information Centers for Disease Control and Prevention, [employer]AD[employer]: TonerPromos.no Community-Based Fall Prevention Programs: TonerPromos.no [employer] on Aging: BaseRingTones.pl Contact a health care provider if: You fall at home. You are afraid of falling at home. You feel weak, drowsy, or dizzy. This information is not intended to replace advice given to you by your health care provider. Make sure you discuss any questions you have with your health care provider. Document Revised: 01/12/2022 Document Reviewed: 01/12/2022 Elsevier Patient Education  2024 [employer]   Managing Pain Without Opioids Opioids are strong medicines used to treat moderate to severe pain. For some people, especially those who have long-term (chronic) pain, opioids may not be the best choice for pain management due to: Side effects like nausea, constipation, and sleepiness. The risk of addiction (opioid use disorder). The longer you take opioids, the greater your risk of addiction. Pain that lasts for more than 3 months is called chronic pain. Managing chronic pain usually requires  more than one approach and is often provided by a team of health care providers working together (multidisciplinary approach). Pain management may be done at a pain management center or pain clinic. How to manage pain without the use of opioids Use non-opioid medicines Non-opioid medicines for pain may include: Over-the-counter or prescription non-steroidal anti-inflammatory drugs (NSAIDs). These may be the first medicines used for pain. They work well for muscle and bone pain, and they reduce swelling. Acetaminophen . This over-the-counter medicine may work well for milder pain but not swelling. Antidepressants. These may be used to treat chronic pain. A certain type of antidepressant (tricyclics) is often used. These medicines are given in lower doses for pain than when used for depression. Anticonvulsants. These  are usually used to treat seizures but may also reduce nerve (neuropathic) pain. Muscle relaxants. These relieve pain caused by sudden muscle tightening (spasms). You may also use a pain medicine that is applied to the skin as a patch, cream, or gel (topical analgesic), such as a numbing medicine. These may cause fewer side effects than medicines taken by mouth. Do certain therapies as directed Some therapies can help with pain management. They include: Physical therapy. You will do exercises to gain strength and flexibility. A physical therapist may teach you exercises to move and stretch parts of your body that are weak, stiff, or painful. You can learn these exercises at physical therapy visits and practice them at home. Physical therapy may also involve: Massage. Heat wraps or applying heat or cold to affected areas. Electrical signals that interrupt pain signals (transcutaneous electrical nerve stimulation, TENS). Weak lasers that reduce pain and swelling (low-level laser therapy). Signals from your body that help you learn to regulate pain (biofeedback). Occupational therapy. This helps you to learn ways to function at home and work with less pain. Recreational therapy. This involves trying new activities or hobbies, such as a physical activity or drawing. Mental health therapy, including: Cognitive behavioral therapy (CBT). This helps you learn coping skills for dealing with pain. Acceptance and commitment therapy (ACT) to change the way you think and react to pain. Relaxation therapies, including muscle relaxation exercises and mindfulness-based stress reduction. Pain management counseling. This may be individual, family, or group counseling.  Receive medical treatments Medical treatments for pain management include: Nerve block injections. These may include a pain blocker and anti-inflammatory medicines. You may have injections: Near the spine to relieve chronic back or neck  pain. Into joints to relieve back or joint pain. Into nerve areas that supply a painful area to relieve body pain. Into muscles (trigger point injections) to relieve some painful muscle conditions. A medical device placed near your spine to help block pain signals and relieve nerve pain or chronic back pain (spinal cord stimulation device). Acupuncture. Follow these instructions at home Medicines Take over-the-counter and prescription medicines only as told by your health care provider. If you are taking pain medicine, ask your health care providers about possible side effects to watch out for. Do not drive or use heavy machinery while taking prescription opioid pain medicine. Lifestyle  Do not use drugs or alcohol to reduce pain. If you drink alcohol, limit how much you have to: 0-1 drink a day for women who are not pregnant. 0-2 drinks a day for men. Know how much alcohol is in a drink. In the [place] one drink equals one 12 oz bottle of beer (355 mL), one 5 oz glass of wine (148 mL), or one 1 oz glass of  hard liquor (44 mL). Do not use any products that contain nicotine or tobacco. These products include cigarettes, chewing tobacco, and vaping devices, such as e-cigarettes. If you need help quitting, ask your health care provider. Eat a healthy diet and maintain a healthy weight. Poor diet and excess weight may make pain worse. Eat foods that are high in fiber. These include fresh fruits and vegetables, whole grains, and beans. Limit foods that are high in fat and processed sugars, such as fried and sweet foods. Exercise regularly. Exercise lowers stress and may help relieve pain. Ask your health care provider what activities and exercises are safe for you. If your health care provider approves, join an exercise class that combines movement and stress reduction. Examples include yoga and tai chi. Get enough sleep. Lack of sleep may make pain worse. Lower stress as much as possible.  Practice stress reduction techniques as told by your therapist. General instructions Work with all your pain management providers to find the treatments that work best for you. You are an important member of your pain management team. There are many things you can do to reduce pain on your own. Consider joining an online or in-person support group for people who have chronic pain. Keep all follow-up visits. This is important. Where to find more information You can find more information about managing pain without opioids from: American Academy of Pain Medicine: painmed.org Institute for Chronic Pain: instituteforchronicpain.org American Chronic Pain Association: theacpa.org Contact a health care provider if: You have side effects from pain medicine. Your pain gets worse or does not get better with treatments or home therapy. You are struggling with anxiety or depression. Summary Many types of pain can be managed without opioids. Chronic pain may respond better to pain management without opioids. Pain is best managed when you and a team of health care providers work together. Pain management without opioids may include non-opioid medicines, medical treatments, physical therapy, mental health therapy, and lifestyle changes. Tell your health care providers if your pain gets worse or is not being managed well enough. This information is not intended to replace advice given to you by your health care provider. Make sure you discuss any questions you have with your health care provider. Document Revised: 08/21/2020 Document Reviewed: 08/21/2020 Elsevier Patient Education  2024 [employer].

## 2023-07-05 NOTE — Progress Notes (Signed)
 Because this visit was a virtual/telehealth visit,  certain criteria was not obtained, such a blood pressure, CBG if applicable, and timed get up and go. Any medications not marked as "taking" were not mentioned during the medication reconciliation part of the visit. Any vitals not documented were not able to be obtained due to this being a telehealth visit or patient was unable to self-report a recent blood pressure reading due to a lack of equipment at home via telehealth. Vitals that have been documented are verbally provided by the patient.  Interactive audio and video telecommunications were attempted between this provider and patient, however failed, due to patient having technical difficulties OR patient did not have access to video capability.  We continued and completed visit with audio only.  Subjective:   Teresa Simon is a 88 y.o. female who presents for Medicare Annual (Subsequent) preventive examination.  Visit Complete: Virtual I connected with  Teresa Simon on 07/05/23 by a audio enabled telemedicine application and verified that I am speaking with the correct person using two identifiers.  Patient Location: Home  Provider Location: Home Office  I discussed the limitations of evaluation and management by telemedicine. The patient expressed understanding and agreed to proceed.  Vital Signs: Because this visit was a virtual/telehealth visit, some criteria may be missing or patient reported. Any vitals not documented were not able to be obtained and vitals that have been documented are patient reported.  Cardiac Risk Factors include: advanced age (>74men, >68 women);dyslipidemia     Objective:    Today's Vitals   07/05/23 0941 07/05/23 0943  Weight: 174 lb (78.9 kg)   Height: 5\' 7"  (1.702 m)   PainSc:  3    Body mass index is 27.25 kg/m.     07/05/2023    9:41 AM 03/10/2023    6:19 PM 06/29/2022    9:02 AM 06/26/2021    9:14 AM 07/21/2015    6:41 PM 07/21/2015    12:38 PM  Advanced Directives  Does Patient Have a Medical Advance Directive? No No Yes No No No  Type of Advance Directive   Living will     Does patient want to make changes to medical advance directive?   No - Patient declined     Would patient like information on creating a medical advance directive? No - Patient declined No - Patient declined  No - Patient declined No - patient declined information     Current Medications (verified) Outpatient Encounter Medications as of 07/05/2023  Medication Sig   acetaminophen  (TYLENOL ) 500 MG tablet Take 500 mg by mouth every 6 (six) hours as needed for mild pain (pain score 1-3).   amLODipine  (NORVASC ) 5 MG tablet Take 1 tablet (5 mg total) by mouth daily.   Coenzyme Q10 100 MG capsule Take 100 mg by mouth daily.   mirabegron  ER (MYRBETRIQ ) 50 MG TB24 tablet Take 1 tablet (50 mg total) by mouth daily.   Misc Natural Products (OSTEO BI-FLEX ADV JOINT SHIELD PO) Take 2 capsules by mouth daily.   oxyCODONE -acetaminophen  (PERCOCET) 5-325 MG tablet Take 1 tablet by mouth every 6 (six) hours as needed for severe pain (pain score 7-10).   simvastatin  (ZOCOR ) 20 MG tablet Take 1 tablet (20 mg total) by mouth at bedtime.   Specialty Vitamins Products (MM BIOTIN/KERATIN) CAPS Take 2 capsules by mouth daily.   VITAMIN E PO Take 1 tablet by mouth daily.   No facility-administered encounter medications on file as of 07/05/2023.  Allergies (verified) Naproxen  and Niacin and related   History: Past Medical History:  Diagnosis Date   Aneurysm (HCC)    Hypercholesteremia    Past Surgical History:  Procedure Laterality Date   CEREBRAL ANEURYSM REPAIR     Family History  Problem Relation Age of Onset   Obesity Sister    Cancer Sister        breast   Kidney disease Sister        dialysis   COPD Sister    Stroke Brother    Diabetes Brother    Heart disease Brother    Thyroid  disease Daughter    Arthritis Daughter    Social History    Socioeconomic History   Marital status: Married    Spouse name: Royston Cornea   Number of children: 2   Years of education: 12   Highest education level: High school graduate  Occupational History   Not on file  Tobacco Use   Smoking status: Never   Smokeless tobacco: Never  Vaping Use   Vaping status: Never Used  Substance and Sexual Activity   Alcohol use: No   Drug use: No   Sexual activity: Yes    Birth control/protection: Other-see comments  Other Topics Concern   Not on file  Social History Narrative   Not on file   Social Drivers of Health   Financial Resource Strain: Low Risk  (07/05/2023)   Overall Financial Resource Strain (CARDIA)    Difficulty of Paying Living Expenses: Not hard at all  Food Insecurity: No Food Insecurity (07/05/2023)   Hunger Vital Sign    Worried About Running Out of Food in the Last Year: Never true    Ran Out of Food in the Last Year: Never true  Transportation Needs: No Transportation Needs (07/05/2023)   PRAPARE - Administrator, Civil Service (Medical): No    Lack of Transportation (Non-Medical): No  Physical Activity: Sufficiently Active (07/05/2023)   Exercise Vital Sign    Days of Exercise per Week: 7 days    Minutes of Exercise per Session: 30 min  Stress: No Stress Concern Present (07/05/2023)   [employer] of Occupational Health - Occupational Stress Questionnaire    Feeling of Stress : Not at all  Social Connections: Moderately Integrated (07/05/2023)   Social Connection and Isolation Panel [NHANES]    Frequency of Communication with Friends and Family: More than three times a week    Frequency of Social Gatherings with Friends and Family: More than three times a week    Attends Religious Services: More than 4 times per year    Active Member of [employer] or Organizations: No    Attends Engineer, structural: Never    Marital Status: Married    Tobacco Counseling Counseling given: Yes   Clinical  Intake:  Pre-visit preparation completed: Yes  Pain : 0-10 Pain Score: 3  Pain Type: Chronic pain Pain Location: Knee Pain Orientation: Right Pain Descriptors / Indicators: Constant, Stabbing (pt states it's a "quick pain" if she steps wrong. it isn't a lingering pain.) Pain Onset: More than a month ago Pain Frequency: Intermittent     BMI - recorded: 27.25 Nutritional Status: BMI 25 -29 Overweight Nutritional Risks: None Diabetes: No  How often do you need to have someone help you when you read instructions, pamphlets, or other written materials from your doctor or pharmacy?: 1 - Never  Interpreter Needed?: No  Information entered by :: Rockne Chyle W CMA  Activities of Daily Living    07/05/2023    9:52 AM  In your present state of health, do you have any difficulty performing the following activities:  Hearing? 0  Vision? 0  Difficulty concentrating or making decisions? 0  Walking or climbing stairs? 0  Dressing or bathing? 0  Doing errands, shopping? 0  Preparing Food and eating ? N  Using the Toilet? N  In the past six months, have you accidently leaked urine? N  Do you have problems with loss of bowel control? N  Managing your Medications? N  Managing your Finances? N  Housekeeping or managing your Housekeeping? N    Patient Care Team: Meldon Sport, MD as PCP - General (Internal Medicine)  Indicate any recent Medical Services you may have received from other than [facility] providers in the past year (date may be approximate).     Assessment:   This is a routine wellness examination for Teresa Simon.  Hearing/Vision screen Hearing Screening - Comments:: Patient denies any hearing difficulties.   Vision Screening - Comments:: Patient denies difficulty seeing and states she doesn't need glasses. Declines referral today    Goals Addressed             This Visit's Progress    Patient Stated       Do the best I can and be able to help my husband who has  leukemia.        Depression Screen    07/05/2023    9:53 AM 05/20/2023    9:54 AM 03/29/2023    3:17 PM 12/02/2022    8:59 AM 06/29/2022    9:04 AM 06/29/2022    9:02 AM 06/19/2022   10:58 AM  PHQ 2/9 Scores  PHQ - 2 Score 0 0 0 2 0 0 0  PHQ- 9 Score 0 0  5       Fall Risk    07/05/2023    9:52 AM 05/20/2023    9:54 AM 03/29/2023    3:16 PM 12/02/2022    8:58 AM 06/29/2022    9:03 AM  Fall Risk   Falls in the past year? 1 1 1  0 0  Number falls in past yr: 1 0 0 0 0  Injury with Fall? 1 1 1  0 0  Risk for fall due to : History of fall(s);Impaired balance/gait;Orthopedic patient;Impaired mobility Impaired balance/gait;History of fall(s) Impaired balance/gait No Fall Risks No Fall Risks  Follow up Falls prevention discussed;Education provided Falls evaluation completed;Education provided;Falls prevention discussed Falls evaluation completed Falls evaluation completed Falls evaluation completed    MEDICARE RISK AT HOME: Medicare Risk at Home Any stairs in or around the home?: Yes If so, are there any without handrails?: No Home free of loose throw rugs in walkways, pet beds, electrical cords, etc?: Yes Adequate lighting in your home to reduce risk of falls?: Yes Life alert?: No Use of a cane, walker or w/c?: No Grab bars in the bathroom?: Yes Shower chair or bench in shower?: Yes Elevated toilet seat or a handicapped toilet?: Yes  TIMED UP AND GO:  Was the test performed?  No    Cognitive Function:    06/29/2022    9:04 AM  MMSE - Mini Mental State Exam  Not completed: Unable to complete        07/05/2023    9:49 AM 06/29/2022    9:06 AM 06/26/2021    9:17 AM  6CIT Screen  What Year? 0 points 0 points  0 points  What month? 0 points 0 points 0 points  What time? 0 points 0 points 0 points  Count back from 20 0 points 0 points 0 points  Months in reverse 0 points 0 points 0 points  Repeat phrase 0 points 0 points 2 points  Total Score 0 points 0 points 2 points     Immunizations Immunization History  Administered Date(s) Administered   Fluad Quad(high Dose 65+) 02/23/2022, 02/23/2023   Influenza-Unspecified 03/23/2020, 03/12/2021   Moderna Covid-19 Vaccine Bivalent Booster 62yrs & up 03/08/2021   Moderna Sars-Covid-2 Vaccination 07/24/2019, 08/21/2019, 03/28/2020   Pneumococcal Conjugate-13 04/05/2014   Pneumococcal Polysaccharide-23 04/01/2010   Tdap 03/02/2013   Zoster Recombinant(Shingrix) 03/11/2018, 06/09/2018    TDAP status: Due, Education has been provided regarding the importance of this vaccine. Advised may receive this vaccine at local pharmacy or Health Dept. Aware to provide a copy of the vaccination record if obtained from local pharmacy or Health Dept. Verbalized acceptance and understanding.  Flu Vaccine status: Up to date  Pneumococcal vaccine status: Up to date  Covid-19 vaccine status: Information provided on how to obtain vaccines.   Qualifies for Shingles Vaccine? No   Zostavax completed No   Shingrix Completed?: Yes  Screening Tests Health Maintenance  Topic Date Due   COVID-19 Vaccine (5 - 2024-25 season) 01/24/2023   DTaP/Tdap/Td (2 - Td or Tdap) 03/03/2023   Medicare Annual Wellness (AWV)  06/30/2023   Pneumonia Vaccine 15+ Years old  Completed   INFLUENZA VACCINE  Completed   DEXA SCAN  Completed   Zoster Vaccines- Shingrix  Completed   HPV VACCINES  Aged Out    Health Maintenance  Health Maintenance Due  Topic Date Due   COVID-19 Vaccine (5 - 2024-25 season) 01/24/2023   DTaP/Tdap/Td (2 - Td or Tdap) 03/03/2023   Medicare Annual Wellness (AWV)  06/30/2023    Colorectal cancer screening: No longer required.   Mammogram status: No longer required due to age.  Bone Density Screening: Not age appropriate for this patient.   Lung Cancer Screening: (Low Dose CT Chest recommended if Age 47-80 years, 20 pack-year currently smoking OR have quit w/in 15years.) does not qualify.   Lung Cancer Screening  Referral: na  Additional Screening:  Hepatitis C Screening: does not qualify   Vision Screening: Recommended annual ophthalmology exams for early detection of glaucoma and other disorders of the eye. Is the patient up to date with their annual eye exam?  No  Who is the provider or what is the name of the office in which the patient attends annual eye exams? Declines referral If pt is not established with a provider, would they like to be referred to a provider to establish care? No .   Dental Screening: Recommended annual dental exams for proper oral hygiene  Diabetic Foot Exam: na  Community Resource Referral / Chronic Care Management: CRR required this visit?  No   CCM required this visit?  No     Plan:     I have personally reviewed and noted the following in the patient's chart:   Medical and social history Use of alcohol, tobacco or illicit drugs  Current medications and supplements including opioid prescriptions. Patient is currently taking opioid prescriptions. Information provided to patient regarding non-opioid alternatives. Patient advised to discuss non-opioid treatment plan with their provider. Functional ability and status Nutritional status Physical activity Advanced directives List of other physicians Hospitalizations, surgeries, and ER visits in previous 12 months Vitals Screenings  to include cognitive, depression, and falls Referrals and appointments  In addition, I have reviewed and discussed with patient certain preventive protocols, quality metrics, and best practice recommendations. A written personalized care plan for preventive services as well as general preventive health recommendations were provided to patient.     Sallye Crease Kaydance Bowie, CMA   07/05/2023   After Visit Summary: (Mail) Due to this being a telephonic visit, the after visit summary with patients personalized plan was offered to patient via mail   Nurse Notes: na

## 2023-11-18 ENCOUNTER — Ambulatory Visit (INDEPENDENT_AMBULATORY_CARE_PROVIDER_SITE_OTHER): Payer: Medicare HMO | Admitting: Internal Medicine

## 2023-11-18 ENCOUNTER — Encounter: Payer: Self-pay | Admitting: Internal Medicine

## 2023-11-18 VITALS — BP 134/72 | HR 83 | Ht 67.0 in | Wt 185.0 lb

## 2023-11-18 DIAGNOSIS — R7303 Prediabetes: Secondary | ICD-10-CM

## 2023-11-18 DIAGNOSIS — R1032 Left lower quadrant pain: Secondary | ICD-10-CM | POA: Diagnosis not present

## 2023-11-18 DIAGNOSIS — R1031 Right lower quadrant pain: Secondary | ICD-10-CM | POA: Diagnosis not present

## 2023-11-18 DIAGNOSIS — I10 Essential (primary) hypertension: Secondary | ICD-10-CM | POA: Diagnosis not present

## 2023-11-18 DIAGNOSIS — N3946 Mixed incontinence: Secondary | ICD-10-CM

## 2023-11-18 DIAGNOSIS — Z8673 Personal history of transient ischemic attack (TIA), and cerebral infarction without residual deficits: Secondary | ICD-10-CM

## 2023-11-18 DIAGNOSIS — G8929 Other chronic pain: Secondary | ICD-10-CM | POA: Insufficient documentation

## 2023-11-18 MED ORDER — OLMESARTAN MEDOXOMIL 5 MG PO TABS
5.0000 mg | ORAL_TABLET | Freq: Every day | ORAL | 1 refills | Status: DC
Start: 2023-11-18 — End: 2024-03-21

## 2023-11-18 NOTE — Assessment & Plan Note (Signed)
 Hold Myrbetriq  for now as she had worsening of pelvic pain with it Had dry mouth with Oxybutynin 

## 2023-11-18 NOTE — Progress Notes (Signed)
 Established Patient Office Visit  Subjective:  Patient ID: Teresa Simon, female    DOB: March 03, 1936  Age: 88 y.o. MRN: [id number]  CC:  Chief Complaint  Patient presents with   Medical Management of Chronic Issues    6 month f/u ,    Pelvic Pain    Pt reports pelvic pain ongoing since 05/2023, has worsened in the last week or so. Has past notes from gyn.    Joint Swelling    Pt reports ankle swelling going up to her knees since the last months. More on left side than right side.     HPI Teresa Simon is a 88 y.o. female with past medical history of cerebral aneurysm s/p clip, TIA and chronic back pain who presents for f/u of her chronic medical conditions.  Her BP was elevated initially today, but improved later. She has had intermittent high BP in the past. She has stopped taking Amlodipine  now. She reports ankle swelling, and has noted leg swelling up to the knee.  She denies any headache, dizziness, chest pain, dyspnea or palpitations currently.  She has history of cerebral aneurysm repair.  Her chronic low back pain has resolved now as she has stopped working.  She has chronic hip and knee pain, for which she uses topical agents.   She takes simvastatin  for HLD.  She has stopped taking Myrbetriq  for urinary urgency, as she was having worse pelvic pain while taking it. She had dry mouth from oxybutynin . She denies any dysuria or hematuria currently. She reports lower abdominal and pelvic pain for the last 6 months.  Her pain is constant, dull, radiating towards the sides. She has been evaluated by OB/GYN.  She had US  of pelvis, which was unremarkable.  She has also tried Metamucil for constipation without much relief in abdominal discomfort.    Past Medical History:  Diagnosis Date   Aneurysm (HCC)    Hypercholesteremia     Past Surgical History:  Procedure Laterality Date   CEREBRAL ANEURYSM REPAIR      Family History  Problem Relation Age of Onset   Obesity  Sister    Cancer Sister        breast   Kidney disease Sister        dialysis   COPD Sister    Stroke Brother    Diabetes Brother    Heart disease Brother    Thyroid  disease Daughter    Arthritis Daughter     Social History   Socioeconomic History   Marital status: Married    Spouse name: Lynwood   Number of children: 2   Years of education: 12   Highest education level: High school graduate  Occupational History   Not on file  Tobacco Use   Smoking status: Never   Smokeless tobacco: Never  Vaping Use   Vaping status: Never Used  Substance and Sexual Activity   Alcohol use: No   Drug use: No   Sexual activity: Yes    Birth control/protection: Other-see comments  Other Topics Concern   Not on file  Social History Narrative   Not on file   Social Drivers of Health   Financial Resource Strain: Low Risk  (07/05/2023)   Overall Financial Resource Strain (CARDIA)    Difficulty of Paying Living Expenses: Not hard at all  Food Insecurity: No Food Insecurity (07/05/2023)   Hunger Vital Sign    Worried About Running Out of Food in the Last Year: Never true  Ran Out of Food in the Last Year: Never true  Transportation Needs: No Transportation Needs (07/05/2023)   PRAPARE - Administrator, Civil Service (Medical): No    Lack of Transportation (Non-Medical): No  Physical Activity: Sufficiently Active (07/05/2023)   Exercise Vital Sign    Days of Exercise per Week: 7 days    Minutes of Exercise per Session: 30 min  Stress: No Stress Concern Present (07/05/2023)   [employer] of Occupational Health - Occupational Stress Questionnaire    Feeling of Stress : Not at all  Social Connections: Moderately Integrated (07/05/2023)   Social Connection and Isolation Panel    Frequency of Communication with Friends and Family: More than three times a week    Frequency of Social Gatherings with Friends and Family: More than three times a week    Attends Religious  Services: More than 4 times per year    Active Member of [employer] or Organizations: No    Attends Banker Meetings: Never    Marital Status: Married  Catering manager Violence: Not At Risk (07/05/2023)   Humiliation, Afraid, Rape, and Kick questionnaire    Fear of Current or Ex-Partner: No    Emotionally Abused: No    Physically Abused: No    Sexually Abused: No    Outpatient Medications Prior to Visit  Medication Sig Dispense Refill   acetaminophen  (TYLENOL ) 500 MG tablet Take 500 mg by mouth every 6 (six) hours as needed for mild pain (pain score 1-3).     Coenzyme Q10 100 MG capsule Take 100 mg by mouth daily.     mirabegron  ER (MYRBETRIQ ) 50 MG TB24 tablet Take 1 tablet (50 mg total) by mouth daily. 30 tablet 11   Misc Natural Products (OSTEO BI-FLEX ADV JOINT SHIELD PO) Take 2 capsules by mouth daily.     oxyCODONE -acetaminophen  (PERCOCET) 5-325 MG tablet Take 1 tablet by mouth every 6 (six) hours as needed for severe pain (pain score 7-10). 10 tablet 0   simvastatin  (ZOCOR ) 20 MG tablet Take 1 tablet (20 mg total) by mouth at bedtime. 90 tablet 3   Specialty Vitamins Products (MM BIOTIN/KERATIN) CAPS Take 2 capsules by mouth daily.     VITAMIN E PO Take 1 tablet by mouth daily.     amLODipine  (NORVASC ) 5 MG tablet Take 1 tablet (5 mg total) by mouth daily. 90 tablet 3   No facility-administered medications prior to visit.    Allergies  Allergen Reactions   Naproxen  Diarrhea   Niacin And Related Other (See Comments)    Redness, burning sensation    ROS Review of Systems  Constitutional:  Negative for chills and fever.  HENT:  Negative for congestion, sinus pressure, sinus pain and sore throat.   Eyes:  Negative for pain and discharge.  Respiratory:  Negative for cough and shortness of breath.   Cardiovascular:  Negative for chest pain and palpitations.  Gastrointestinal:  Positive for abdominal pain and constipation. Negative for diarrhea, nausea and vomiting.   Endocrine: Negative for polydipsia and polyuria.  Genitourinary:  Negative for dysuria, flank pain and hematuria.  Musculoskeletal:  Positive for back pain (Chronic). Negative for neck pain and neck stiffness.  Skin:  Negative for rash.  Neurological:  Negative for dizziness and weakness.  Psychiatric/Behavioral:  Negative for agitation and behavioral problems.       Objective:    Physical Exam Vitals reviewed.  Constitutional:      General: She is not in  acute distress.    Appearance: She is not diaphoretic.  HENT:     Head: Normocephalic and atraumatic.     Nose: Nose normal. No congestion.     Mouth/Throat:     Mouth: Mucous membranes are moist.     Pharynx: No posterior oropharyngeal erythema.   Eyes:     General: No scleral icterus.    Extraocular Movements: Extraocular movements intact.    Cardiovascular:     Rate and Rhythm: Normal rate and regular rhythm.     Heart sounds: Normal heart sounds. No murmur heard. Pulmonary:     Breath sounds: Normal breath sounds. No wheezing or rales.  Abdominal:     Palpations: Abdomen is soft.     Tenderness: There is abdominal tenderness (RLQ and LLQ).   Musculoskeletal:     Cervical back: Neck supple. No tenderness.     Lumbar back: No swelling or bony tenderness.     Right lower leg: Edema (1+) present.     Left lower leg: Edema (1+) present.   Skin:    General: Skin is warm.     Findings: No rash.   Neurological:     General: No focal deficit present.     Mental Status: She is alert and oriented to person, place, and time.     Sensory: No sensory deficit.     Motor: No weakness.   Psychiatric:        Mood and Affect: Mood normal.        Behavior: Behavior normal.     BP 134/72 (BP Location: Left Arm)   Pulse 83   Ht 5' 7 (1.702 m)   Wt 185 lb (83.9 kg)   SpO2 95%   BMI 28.98 kg/m  Wt Readings from Last 3 Encounters:  11/18/23 185 lb (83.9 kg)  07/05/23 174 lb (78.9 kg)  05/20/23 183 lb 9.6 oz (83.3  kg)    Lab Results  Component Value Date   TSH 0.675 05/20/2023   Lab Results  Component Value Date   WBC 7.0 05/20/2023   HGB 13.3 05/20/2023   HCT 39.6 05/20/2023   MCV 93 05/20/2023   PLT 202 05/20/2023   Lab Results  Component Value Date   NA 144 05/20/2023   K 4.5 05/20/2023   CO2 23 05/20/2023   GLUCOSE 101 (H) 05/20/2023   BUN 12 05/20/2023   CREATININE 0.81 05/20/2023   BILITOT 0.5 05/20/2023   ALKPHOS 50 05/20/2023   AST 28 05/20/2023   ALT 23 05/20/2023   PROT 6.4 05/20/2023   ALBUMIN 4.3 05/20/2023   CALCIUM 9.6 05/20/2023   ANIONGAP 11 03/10/2023   EGFR 71 05/20/2023   Lab Results  Component Value Date   CHOL 154 05/20/2023   Lab Results  Component Value Date   HDL 68 05/20/2023   Lab Results  Component Value Date   LDLCALC 67 05/20/2023   Lab Results  Component Value Date   TRIG 104 05/20/2023   Lab Results  Component Value Date   CHOLHDL 2.3 05/20/2023   Lab Results  Component Value Date   HGBA1C 6.3 (H) 05/20/2023      Assessment & Plan:   Problem List Items Addressed This Visit       Cardiovascular and Mediastinum   Essential hypertension - Primary   BP Readings from Last 1 Encounters:  11/18/23 134/72   Well-controlled Although would be acceptable at her age, but due to her history of cerebral aneurysm, started  olmesartan 5 mg once daily Discontinue amlodipine  5 mg QD as her leg swelling can worsen with amlodipine  Counseled for compliance with the medications Advised DASH diet and moderate exercise/walking as tolerated      Relevant Medications   olmesartan (BENICAR) 5 MG tablet     Other   History of TIA (transient ischemic attack)   On Aspirin  and statin No focal neurologic deficits      Mixed stress and urge urinary incontinence   Hold Myrbetriq  for now as she had worsening of pelvic pain with it Had dry mouth with Oxybutynin       Prediabetes   Lab Results  Component Value Date   HGBA1C 6.3 (H) 05/20/2023    Advised to follow DASH diet      Relevant Orders   Hemoglobin A1c   Basic Metabolic Panel (BMET)   Chronic bilateral lower abdominal pain   Unclear etiology Had US  of pelvis, which was unremarkable Considering her chronic abdominal/pelvic pain, check CT abdomen pelvis Advised to take MiraLAX as needed for constipation Hold Myrbetriq  for now as she reports worsening of pelvic pain with it      Relevant Orders   CT ABDOMEN PELVIS WO CONTRAST    Meds ordered this encounter  Medications   olmesartan (BENICAR) 5 MG tablet    Sig: Take 1 tablet (5 mg total) by mouth daily.    Dispense:  90 tablet    Refill:  1    Follow-up: Return in about 4 months (around 03/19/2024) for HTN.    Suzzane MARLA Blanch, MD

## 2023-11-18 NOTE — Assessment & Plan Note (Signed)
 Unclear etiology Had US  of pelvis, which was unremarkable Considering her chronic abdominal/pelvic pain, check CT abdomen pelvis Advised to take MiraLAX as needed for constipation Hold Myrbetriq  for now as she reports worsening of pelvic pain with it

## 2023-11-18 NOTE — Assessment & Plan Note (Signed)
On Aspirin and statin No focal neurologic deficits 

## 2023-11-18 NOTE — Assessment & Plan Note (Signed)
 Lab Results  Component Value Date   HGBA1C 6.3 (H) 05/20/2023   Advised to follow DASH diet

## 2023-11-18 NOTE — Patient Instructions (Signed)
 Please start taking Olmesartan instead of Amlodipine .  Please continue to take medications as prescribed.  Please continue to follow low salt diet and ambulate as tolerated.  Please get blood tests done after 2 weeks.  Please perform leg elevation as tolerated for leg swelling.  Please get CT abdomen pelvis as scheduled.

## 2023-11-18 NOTE — Assessment & Plan Note (Signed)
 BP Readings from Last 1 Encounters:  11/18/23 134/72   Well-controlled Although would be acceptable at her age, but due to her history of cerebral aneurysm, started olmesartan 5 mg once daily Discontinue amlodipine  5 mg QD as her leg swelling can worsen with amlodipine  Counseled for compliance with the medications Advised DASH diet and moderate exercise/walking as tolerated

## 2023-11-19 ENCOUNTER — Telehealth: Payer: Self-pay

## 2023-11-19 NOTE — Telephone Encounter (Signed)
 Copied from CRM [id number]. Topic: General - Call Back - No Documentation >> Nov 19, 2023  2:26 PM Geneva B wrote: Reason for CRM: patient missed call from office please call patient back [phone]

## 2023-11-22 ENCOUNTER — Telehealth: Payer: Self-pay | Admitting: Internal Medicine

## 2023-11-22 NOTE — Telephone Encounter (Signed)
 Left detailed vm, per last after visit summary from 11/18/23, pt needs to come back in 2 weeks to have blood work done , she can walk in for this, also CT was ordered for scheduling she should receive a call to have this scheduled, if additional questions she may call back.

## 2023-11-22 NOTE — Telephone Encounter (Unsigned)
 Copied from CRM [id number]. Topic: General - Call Back - No Documentation >> Nov 22, 2023 11:47 AM Donee H wrote: Reason for CRM: Patient stated was returning missed call. There is no documentation left regarding a phone call. Patient stated it was a message stated to callback regarding a test. Patient not sure exactly of what kind of test. Please follow up with patient at [phone]

## 2023-11-22 NOTE — Telephone Encounter (Signed)
 No documentation on any messages needed to be relayed to pt.

## 2023-11-29 ENCOUNTER — Ambulatory Visit (HOSPITAL_COMMUNITY)
Admission: RE | Admit: 2023-11-29 | Discharge: 2023-11-29 | Disposition: A | Discharge status: Home / Self Care | Source: Ambulatory Visit | Attending: Internal Medicine | Admitting: Internal Medicine

## 2023-11-29 DIAGNOSIS — R1032 Left lower quadrant pain: Secondary | ICD-10-CM | POA: Diagnosis not present

## 2023-11-29 DIAGNOSIS — R1031 Right lower quadrant pain: Secondary | ICD-10-CM | POA: Diagnosis not present

## 2023-11-29 DIAGNOSIS — K5730 Diverticulosis of large intestine without perforation or abscess without bleeding: Secondary | ICD-10-CM | POA: Diagnosis not present

## 2023-11-29 DIAGNOSIS — K4090 Unilateral inguinal hernia, without obstruction or gangrene, not specified as recurrent: Secondary | ICD-10-CM | POA: Diagnosis not present

## 2023-11-29 DIAGNOSIS — R1030 Lower abdominal pain, unspecified: Secondary | ICD-10-CM | POA: Diagnosis not present

## 2023-11-29 DIAGNOSIS — G8929 Other chronic pain: Secondary | ICD-10-CM | POA: Insufficient documentation

## 2023-11-30 ENCOUNTER — Ambulatory Visit: Payer: Self-pay | Admitting: Internal Medicine

## 2023-12-01 ENCOUNTER — Encounter: Payer: Self-pay | Admitting: Specialist

## 2023-12-02 DIAGNOSIS — R7303 Prediabetes: Secondary | ICD-10-CM | POA: Diagnosis not present

## 2023-12-03 LAB — BASIC METABOLIC PANEL WITH GFR
BUN/Creatinine Ratio: 16 (ref 12–28)
BUN: 15 mg/dL (ref 8–27)
CO2: 23 mmol/L (ref 20–29)
Calcium: 9.7 mg/dL (ref 8.7–10.3)
Chloride: 106 mmol/L (ref 96–106)
Creatinine, Ser: 0.91 mg/dL (ref 0.57–1.00)
Glucose: 106 mg/dL — ABNORMAL HIGH (ref 70–99)
Potassium: 4.8 mmol/L (ref 3.5–5.2)
Sodium: 142 mmol/L (ref 134–144)
eGFR: 62 mL/min/1.73 (ref >59)

## 2023-12-03 LAB — HEMOGLOBIN A1C
Est. average glucose Bld gHb Est-mCnc: 131 mg/dL
Hgb A1c MFr Bld: 6.2 % — ABNORMAL HIGH (ref 4.8–5.6)

## 2024-01-28 DIAGNOSIS — H52209 Unspecified astigmatism, unspecified eye: Secondary | ICD-10-CM | POA: Diagnosis not present

## 2024-01-28 DIAGNOSIS — H524 Presbyopia: Secondary | ICD-10-CM | POA: Diagnosis not present

## 2024-01-28 DIAGNOSIS — H5203 Hypermetropia, bilateral: Secondary | ICD-10-CM | POA: Diagnosis not present

## 2024-03-16 ENCOUNTER — Ambulatory Visit: Payer: Self-pay

## 2024-03-16 NOTE — Telephone Encounter (Signed)
 FYI Only or Action Required?: Action required by provider: request for appointment and clinical question for provider.  Patient was last seen in primary care on 11/18/2023 by Teresa Suzzane POUR, MD.  Called Nurse Triage reporting Diarrhea.  Symptoms began several days ago.  Interventions attempted: Prescription medications: imodium, pepto bismol.  Symptoms are: unchanged.  Triage Disposition: See Physician Within 24 Hours  Patient/caregiver understands and will follow disposition?: No, wishes to speak with PCP   Copied from CRM #[id number]. Topic: Clinical - Red Word Triage >> Mar 16, 2024 10:07 AM Roselie BROCKS wrote: Red Word that prompted transfer to Nurse Triage: Patient has extreme diarrhea, over counter meds not helping, and patient states she is sick, fever achy . Reason for Disposition  [1] SEVERE diarrhea (e.g., 7 or more times / day more than normal) AND [2] present > 24 hours (1 day)  Answer Assessment - Initial Assessment Questions No available appts today with pcp. Offered alternative clinic, patient declined. Patient requests appt/call back with Dr. Tobie and medication for diarrhea.  Advised UC today and ED if symptoms worsen.  1. DIARRHEA SEVERITY: How bad is the diarrhea? How many more stools have you had in the past 24 hours than normal?      More than 7x, unsure how many 2. ONSET: When did the diarrhea begin?      Last Friday; anytime eat or drink have to use bathroom, no appetite 3. STOOL DESCRIPTION:  How loose or watery is the diarrhea? What is the stool color? Is there any blood or mucous in the stool?     Brown, black, watery 4. VOMITING: Are you also vomiting? If Yes, ask: How many times in the past 24 hours?      No 5. ABDOMEN PAIN: Are you having any abdomen pain? If Yes, ask: What does it feel like? (e.g., crampy, dull, intermittent, constant)      Rumbling right before having to go 6. ABDOMEN PAIN SEVERITY: If present, ask: How bad is the  pain?  (e.g., Scale 1-10; mild, moderate, or severe)     No pain currently; taking Immodium, pepto bismol 7. ORAL INTAKE: If vomiting, Have you been able to drink liquids? How much liquids have you had in the past 24 hours?     Not sure how much drinking 8. HYDRATION: Any signs of dehydration? (e.g., dry mouth [not just dry lips], too weak to stand, dizziness, new weight loss) When did you last urinate?     Denies faint, dizziness, dry mouth, urinating without difficulties 9. EXPOSURE: Have you traveled to a foreign country recently? Have you been exposed to anyone with diarrhea? Could you have eaten any food that was spoiled?     Sister; had 2 antibiotics, not sure of illness or if had diarrhea 10. ANTIBIOTIC USE: Are you taking antibiotics now or have you taken antibiotics in the past 2 months?       no 11. OTHER SYMPTOMS: Do you have any other symptoms? (e.g., fever, blood in stool)       Denies fever, chills,n/v, sob, faint, blood stool  Protocols used: Diarrhea-A-AH

## 2024-03-21 ENCOUNTER — Ambulatory Visit (INDEPENDENT_AMBULATORY_CARE_PROVIDER_SITE_OTHER): Admitting: Internal Medicine

## 2024-03-21 ENCOUNTER — Encounter: Payer: Self-pay | Admitting: Internal Medicine

## 2024-03-21 VITALS — BP 134/65 | HR 105 | Ht 63.0 in | Wt 172.4 lb

## 2024-03-21 DIAGNOSIS — E785 Hyperlipidemia, unspecified: Secondary | ICD-10-CM

## 2024-03-21 DIAGNOSIS — K219 Gastro-esophageal reflux disease without esophagitis: Secondary | ICD-10-CM | POA: Diagnosis not present

## 2024-03-21 DIAGNOSIS — I10 Essential (primary) hypertension: Secondary | ICD-10-CM

## 2024-03-21 DIAGNOSIS — Z7189 Other specified counseling: Secondary | ICD-10-CM | POA: Diagnosis not present

## 2024-03-21 DIAGNOSIS — Z86018 Personal history of other benign neoplasm: Secondary | ICD-10-CM | POA: Diagnosis not present

## 2024-03-21 MED ORDER — SIMVASTATIN 20 MG PO TABS: 20.0000 mg | ORAL_TABLET | Freq: Every day | ORAL | 3 refills | Status: AC

## 2024-03-21 MED ORDER — FAMOTIDINE 20 MG PO TABS: 20.0000 mg | ORAL_TABLET | Freq: Two times a day (BID) | ORAL | 1 refills | Status: AC

## 2024-03-21 MED ORDER — OLMESARTAN MEDOXOMIL 5 MG PO TABS: 5.0000 mg | ORAL_TABLET | Freq: Every day | ORAL | 3 refills | Status: AC

## 2024-03-21 NOTE — Assessment & Plan Note (Signed)
 Lost her husband in 08/25 Has crying spells, anhedonia, insomnia and lack of appetite Referred to LCSW ([facility]) Advised to stay in contact with family members and join church again

## 2024-03-21 NOTE — Patient Instructions (Signed)
 Please take Pepcid as needed for acid reflux.  Please continue to take medications as prescribed.  Please continue to follow low salt diet and ambulate as tolerated.

## 2024-03-21 NOTE — Assessment & Plan Note (Signed)
 BP Readings from Last 1 Encounters:  03/21/24 134/65   Well-controlled Although would be acceptable at her age, but due to her history of cerebral aneurysm, started olmesartan  5 mg once daily Counseled for compliance with the medications Advised DASH diet and moderate exercise/walking as tolerated

## 2024-03-21 NOTE — Progress Notes (Unsigned)
 Established Patient Office Visit  Subjective:  Patient ID: Teresa Simon, female    DOB: 10/02/1935  Age: 88 y.o. MRN: [id number]  CC:  Chief Complaint  Patient presents with   Hypertension    4 month f/u     HPI Teresa Simon is a 88 y.o. female with past medical history of cerebral aneurysm s/p clip, TIA and chronic back pain who presents for f/u of her chronic medical conditions.  Her BP was WNL today. She has had intermittent high BP in the past. She has stopped taking Amlodipine  now. She reports ankle swelling, and has noted leg swelling up to the knee.  She denies any headache, dizziness, chest pain, dyspnea or palpitations currently.  She has history of cerebral aneurysm repair.  Her chronic low back pain has resolved now as she has stopped working.  She has chronic hip and knee pain, for which she uses topical agents.   She takes simvastatin  for HLD.  She has stopped taking Myrbetriq  for urinary urgency, as she was having worse pelvic pain while taking it. She had dry mouth from oxybutynin . She denies any dysuria or hematuria currently. She reports lower abdominal and pelvic pain for the last 6 months.  Her pain is constant, dull, radiating towards the sides. She has been evaluated by OB/GYN.  She had US  of pelvis, which was unremarkable.  She has also tried Metamucil for constipation without much relief in abdominal discomfort.    Past Medical History:  Diagnosis Date   Aneurysm    Hypercholesteremia     Past Surgical History:  Procedure Laterality Date   CEREBRAL ANEURYSM REPAIR      Family History  Problem Relation Age of Onset   Obesity Sister    Cancer Sister        breast   Kidney disease Sister        dialysis   COPD Sister    Stroke Brother    Diabetes Brother    Heart disease Brother    Thyroid  disease Daughter    Arthritis Daughter     Social History   Socioeconomic History   Marital status: Married    Spouse name: Lynwood   Number of  children: 2   Years of education: 12   Highest education level: High school graduate  Occupational History   Not on file  Tobacco Use   Smoking status: Never   Smokeless tobacco: Never  Vaping Use   Vaping status: Never Used  Substance and Sexual Activity   Alcohol use: No   Drug use: No   Sexual activity: Yes    Birth control/protection: Other-see comments  Other Topics Concern   Not on file  Social History Narrative   Not on file   Social Drivers of Health   Financial Resource Strain: Low Risk  (07/05/2023)   Overall Financial Resource Strain (CARDIA)    Difficulty of Paying Living Expenses: Not hard at all  Food Insecurity: No Food Insecurity (07/05/2023)   Hunger Vital Sign    Worried About Running Out of Food in the Last Year: Never true    Ran Out of Food in the Last Year: Never true  Transportation Needs: No Transportation Needs (07/05/2023)   PRAPARE - Administrator, Civil Service (Medical): No    Lack of Transportation (Non-Medical): No  Physical Activity: Sufficiently Active (07/05/2023)   Exercise Vital Sign    Days of Exercise per Week: 7 days    Minutes of  Exercise per Session: 30 min  Stress: No Stress Concern Present (07/05/2023)   [employer] of Occupational Health - Occupational Stress Questionnaire    Feeling of Stress : Not at all  Social Connections: Moderately Integrated (07/05/2023)   Social Connection and Isolation Panel    Frequency of Communication with Friends and Family: More than three times a week    Frequency of Social Gatherings with Friends and Family: More than three times a week    Attends Religious Services: More than 4 times per year    Active Member of [employer] or Organizations: No    Attends Banker Meetings: Never    Marital Status: Married  Catering Manager Violence: Not At Risk (07/05/2023)   Humiliation, Afraid, Rape, and Kick questionnaire    Fear of Current or Ex-Partner: No    Emotionally Abused: No     Physically Abused: No    Sexually Abused: No    Outpatient Medications Prior to Visit  Medication Sig Dispense Refill   acetaminophen  (TYLENOL ) 500 MG tablet Take 500 mg by mouth every 6 (six) hours as needed for mild pain (pain score 1-3).     Coenzyme Q10 100 MG capsule Take 100 mg by mouth daily.     mirabegron  ER (MYRBETRIQ ) 50 MG TB24 tablet Take 1 tablet (50 mg total) by mouth daily. 30 tablet 11   Misc Natural Products (OSTEO BI-FLEX ADV JOINT SHIELD PO) Take 2 capsules by mouth daily.     oxyCODONE -acetaminophen  (PERCOCET) 5-325 MG tablet Take 1 tablet by mouth every 6 (six) hours as needed for severe pain (pain score 7-10). 10 tablet 0   Specialty Vitamins Products (MM BIOTIN/KERATIN) CAPS Take 2 capsules by mouth daily.     VITAMIN E PO Take 1 tablet by mouth daily.     olmesartan  (BENICAR ) 5 MG tablet Take 1 tablet (5 mg total) by mouth daily. 90 tablet 1   simvastatin  (ZOCOR ) 20 MG tablet Take 1 tablet (20 mg total) by mouth at bedtime. 90 tablet 3   No facility-administered medications prior to visit.    Allergies  Allergen Reactions   Naproxen  Diarrhea   Niacin And Related Other (See Comments)    Redness, burning sensation    ROS Review of Systems  Constitutional:  Negative for chills and fever.  HENT:  Negative for congestion, sinus pressure, sinus pain and sore throat.   Eyes:  Negative for pain and discharge.  Respiratory:  Negative for cough and shortness of breath.   Cardiovascular:  Negative for chest pain and palpitations.  Gastrointestinal:  Positive for abdominal pain and constipation. Negative for diarrhea, nausea and vomiting.  Endocrine: Negative for polydipsia and polyuria.  Genitourinary:  Negative for dysuria, flank pain and hematuria.  Musculoskeletal:  Positive for back pain (Chronic). Negative for neck pain and neck stiffness.  Skin:  Negative for rash.  Neurological:  Negative for dizziness and weakness.  Psychiatric/Behavioral:  Negative  for agitation and behavioral problems.       Objective:    Physical Exam Vitals reviewed.  Constitutional:      General: She is not in acute distress.    Appearance: She is not diaphoretic.  HENT:     Head: Normocephalic and atraumatic.     Nose: Nose normal. No congestion.     Mouth/Throat:     Mouth: Mucous membranes are moist.     Pharynx: No posterior oropharyngeal erythema.  Eyes:     General: No scleral icterus.  Extraocular Movements: Extraocular movements intact.  Cardiovascular:     Rate and Rhythm: Normal rate and regular rhythm.     Heart sounds: Normal heart sounds. No murmur heard. Pulmonary:     Breath sounds: Normal breath sounds. No wheezing or rales.  Abdominal:     Palpations: Abdomen is soft.     Tenderness: There is abdominal tenderness (Epigastric).  Musculoskeletal:     Cervical back: Neck supple. No tenderness.     Lumbar back: No swelling or bony tenderness.     Right lower leg: Edema (1+) present.     Left lower leg: Edema (1+) present.  Skin:    General: Skin is warm.     Findings: Lesion (Light brownish plaque over left temporal area - about 2 cm in diameter) present. No rash.  Neurological:     General: No focal deficit present.     Mental Status: She is alert and oriented to person, place, and time.     Sensory: No sensory deficit.     Motor: No weakness.  Psychiatric:        Mood and Affect: Mood normal.        Behavior: Behavior normal.     BP 134/65   Pulse (!) 105   Ht 5' 3 (1.6 m)   Wt 172 lb 6.4 oz (78.2 kg)   SpO2 92%   BMI 30.54 kg/m  Wt Readings from Last 3 Encounters:  03/21/24 172 lb 6.4 oz (78.2 kg)  11/18/23 185 lb (83.9 kg)  07/05/23 174 lb (78.9 kg)    Lab Results  Component Value Date   TSH 0.675 05/20/2023   Lab Results  Component Value Date   WBC 7.0 05/20/2023   HGB 13.3 05/20/2023   HCT 39.6 05/20/2023   MCV 93 05/20/2023   PLT 202 05/20/2023   Lab Results  Component Value Date   NA 142  12/02/2023   K 4.8 12/02/2023   CO2 23 12/02/2023   GLUCOSE 106 (H) 12/02/2023   BUN 15 12/02/2023   CREATININE 0.91 12/02/2023   BILITOT 0.5 05/20/2023   ALKPHOS 50 05/20/2023   AST 28 05/20/2023   ALT 23 05/20/2023   PROT 6.4 05/20/2023   ALBUMIN 4.3 05/20/2023   CALCIUM 9.7 12/02/2023   ANIONGAP 11 03/10/2023   EGFR 62 12/02/2023   Lab Results  Component Value Date   CHOL 154 05/20/2023   Lab Results  Component Value Date   HDL 68 05/20/2023   Lab Results  Component Value Date   LDLCALC 67 05/20/2023   Lab Results  Component Value Date   TRIG 104 05/20/2023   Lab Results  Component Value Date   CHOLHDL 2.3 05/20/2023   Lab Results  Component Value Date   HGBA1C 6.2 (H) 12/02/2023      Assessment & Plan:   Problem List Items Addressed This Visit       Cardiovascular and Mediastinum   Essential hypertension - Primary   BP Readings from Last 1 Encounters:  03/21/24 134/65   Well-controlled Although would be acceptable at her age, but due to her history of cerebral aneurysm, started olmesartan  5 mg once daily Counseled for compliance with the medications Advised DASH diet and moderate exercise/walking as tolerated      Relevant Medications   olmesartan  (BENICAR ) 5 MG tablet   simvastatin  (ZOCOR ) 20 MG tablet     Digestive   Gastroesophageal reflux disease   Relevant Medications   famotidine (PEPCID) 20 MG  tablet     Other   Hyperlipidemia   Relevant Medications   olmesartan  (BENICAR ) 5 MG tablet   simvastatin  (ZOCOR ) 20 MG tablet   History of benign neoplasm of skin   Relevant Orders   Ambulatory referral to Dermatology   Grief counseling   Lost her husband in 08/25 Has crying spells, anhedonia, insomnia and lack of appetite Referred to LCSW ([facility]) Advised to stay in contact with family members and join church again       Relevant Orders   AMB Referral [facility] Care Management     Meds ordered this encounter  Medications    famotidine (PEPCID) 20 MG tablet    Sig: Take 1 tablet (20 mg total) by mouth 2 (two) times daily.    Dispense:  60 tablet    Refill:  1   olmesartan  (BENICAR ) 5 MG tablet    Sig: Take 1 tablet (5 mg total) by mouth daily.    Dispense:  90 tablet    Refill:  3   simvastatin  (ZOCOR ) 20 MG tablet    Sig: Take 1 tablet (20 mg total) by mouth at bedtime.    Dispense:  90 tablet    Refill:  3    Follow-up: Return in about 4 months (around 07/22/2024) for HTN and HLD.    Suzzane MARLA Blanch, MD

## 2024-03-22 ENCOUNTER — Telehealth: Payer: Self-pay

## 2024-03-22 NOTE — Assessment & Plan Note (Addendum)
 Her epigastric discomfort/pain could be due to gastritis/GERD Started Pepcid 20 mg BID

## 2024-03-22 NOTE — Progress Notes (Signed)
 Complex Care Management Note  Care Guide Note 03/22/2024 Name: Elwyn Lowden MRN: [id number] DOB: 24-Jun-1935  Achsah Mcquade is a 88 y.o. year old female who sees Tobie Suzzane POUR, MD for primary care. I reached out to [facility] by phone today to offer complex care management services.  Ms. Chesterfield was given information about Complex Care Management services today including:   The Complex Care Management services include support from the care team which includes your Nurse Care Manager, Clinical Social Worker, or Pharmacist.  The Complex Care Management team is here to help remove barriers to the health concerns and goals most important to you. Complex Care Management services are voluntary, and the patient may decline or stop services at any time by request to their care team member.   Complex Care Management Consent Status: Patient agreed to services and verbal consent obtained.   Follow up plan:  Telephone appointment with complex care management team member scheduled for:  04/11/2024  Encounter Outcome:  Patient Scheduled  Jeoffrey Buffalo , RMA       [facility], [facility] Guide  Direct Dial: [phone]  Website: [email or web address]

## 2024-03-22 NOTE — Assessment & Plan Note (Signed)
 Current skin lesion appears to be actinic keratosis Considering her history of skin cancer, referred to dermatology

## 2024-03-22 NOTE — Assessment & Plan Note (Signed)
Well controlled, on Simvastatin currently 

## 2024-03-26 ENCOUNTER — Emergency Department (HOSPITAL_COMMUNITY)

## 2024-03-26 ENCOUNTER — Inpatient Hospital Stay (HOSPITAL_COMMUNITY)
Admission: EM | Admit: 2024-03-26 | Discharge: 2024-03-28 | DRG: 175 | Disposition: A | Discharge status: Home / Self Care | Attending: Hospitalist | Admitting: Hospitalist

## 2024-03-26 ENCOUNTER — Encounter (HOSPITAL_COMMUNITY): Payer: Self-pay | Admitting: Internal Medicine

## 2024-03-26 ENCOUNTER — Other Ambulatory Visit: Payer: Self-pay

## 2024-03-26 DIAGNOSIS — E440 Moderate protein-calorie malnutrition: Secondary | ICD-10-CM | POA: Diagnosis present

## 2024-03-26 DIAGNOSIS — I10 Essential (primary) hypertension: Secondary | ICD-10-CM | POA: Diagnosis not present

## 2024-03-26 DIAGNOSIS — I824Y2 Acute embolism and thrombosis of unspecified deep veins of left proximal lower extremity: Secondary | ICD-10-CM | POA: Diagnosis not present

## 2024-03-26 DIAGNOSIS — I82409 Acute embolism and thrombosis of unspecified deep veins of unspecified lower extremity: Secondary | ICD-10-CM | POA: Diagnosis present

## 2024-03-26 DIAGNOSIS — I2699 Other pulmonary embolism without acute cor pulmonale: Secondary | ICD-10-CM | POA: Diagnosis not present

## 2024-03-26 DIAGNOSIS — I2609 Other pulmonary embolism with acute cor pulmonale: Principal | ICD-10-CM

## 2024-03-26 DIAGNOSIS — R7303 Prediabetes: Secondary | ICD-10-CM | POA: Diagnosis present

## 2024-03-26 DIAGNOSIS — I82402 Acute embolism and thrombosis of unspecified deep veins of left lower extremity: Secondary | ICD-10-CM | POA: Diagnosis not present

## 2024-03-26 DIAGNOSIS — I82412 Acute embolism and thrombosis of left femoral vein: Secondary | ICD-10-CM | POA: Diagnosis not present

## 2024-03-26 DIAGNOSIS — R600 Localized edema: Secondary | ICD-10-CM | POA: Diagnosis not present

## 2024-03-26 DIAGNOSIS — M79662 Pain in left lower leg: Secondary | ICD-10-CM | POA: Diagnosis not present

## 2024-03-26 DIAGNOSIS — I82452 Acute embolism and thrombosis of left peroneal vein: Secondary | ICD-10-CM | POA: Diagnosis not present

## 2024-03-26 LAB — CBC WITH DIFFERENTIAL/PLATELET
Basophils Absolute: 0.1 K/uL (ref 0.0–0.1)
Basophils Relative: 1 %
Eosinophils Absolute: 3 K/uL — ABNORMAL HIGH (ref 0.0–0.5)
Eosinophils Relative: 24 %
HCT: 38.9 % (ref 36.0–46.0)
Hemoglobin: 13.1 g/dL (ref 12.0–15.0)
Lymphocytes Relative: 23 %
Lymphs Abs: 2.9 K/uL (ref 0.7–4.0)
MCH: 31.2 pg (ref 26.0–34.0)
MCHC: 33.7 g/dL (ref 30.0–36.0)
MCV: 92.6 fL (ref 80.0–100.0)
Monocytes Absolute: 0.8 K/uL (ref 0.1–1.0)
Monocytes Relative: 6 %
Neutro Abs: 5.8 K/uL (ref 1.7–7.7)
Neutrophils Relative %: 46 %
Platelets: 183 K/uL (ref 150–400)
RBC: 4.2 MIL/uL (ref 3.87–5.11)
RDW: 12.7 % (ref 11.5–15.5)
Smear Review: NORMAL
WBC: 12.7 K/uL — ABNORMAL HIGH (ref 4.0–10.5)
nRBC: 0 % (ref 0.0–0.2)

## 2024-03-26 LAB — PROTIME-INR
INR: 1.2 (ref 0.8–1.2)
Prothrombin Time: 15.5 s — ABNORMAL HIGH (ref 11.4–15.2)

## 2024-03-26 LAB — BASIC METABOLIC PANEL WITH GFR
Anion gap: 12 (ref 5–15)
BUN: 13 mg/dL (ref 8–23)
CO2: 26 mmol/L (ref 22–32)
Calcium: 9.4 mg/dL (ref 8.9–10.3)
Chloride: 104 mmol/L (ref 98–111)
Creatinine, Ser: 1.07 mg/dL — ABNORMAL HIGH (ref 0.44–1.00)
GFR, Estimated: 51 mL/min — ABNORMAL LOW (ref >60)
Glucose, Bld: 113 mg/dL — ABNORMAL HIGH (ref 70–99)
Potassium: 4 mmol/L (ref 3.5–5.1)
Sodium: 142 mmol/L (ref 135–145)

## 2024-03-26 LAB — TROPONIN T, HIGH SENSITIVITY
Troponin T High Sensitivity: 24 ng/L — ABNORMAL HIGH (ref 0–19)
Troponin T High Sensitivity: 24 ng/L — ABNORMAL HIGH (ref 0–19)

## 2024-03-26 LAB — APTT: aPTT: 31 s (ref 24–36)

## 2024-03-26 LAB — PRO BRAIN NATRIURETIC PEPTIDE: Pro Brain Natriuretic Peptide: 461 pg/mL — ABNORMAL HIGH (ref <300.0)

## 2024-03-26 LAB — MAGNESIUM: Magnesium: 1.9 mg/dL (ref 1.7–2.4)

## 2024-03-26 MED ORDER — PROCHLORPERAZINE EDISYLATE 10 MG/2ML IJ SOLN: 5.0000 mg | Freq: Four times a day (QID) | INTRAMUSCULAR | Status: DC | PRN

## 2024-03-26 MED ORDER — LACTATED RINGERS IV SOLN: INTRAVENOUS | Status: AC

## 2024-03-26 MED ORDER — HEPARIN (PORCINE) 25000 UT/250ML-% IV SOLN
1200.0000 [IU]/h | INTRAVENOUS | Status: DC
  Administered 2024-03-26 – 2024-03-28 (×3): 1200 [IU]/h via INTRAVENOUS
  Filled 2024-03-26 (×3): qty 250

## 2024-03-26 MED ORDER — ONDANSETRON HCL 4 MG/2ML IJ SOLN: 4.0000 mg | Freq: Four times a day (QID) | INTRAMUSCULAR | Status: DC | PRN

## 2024-03-26 MED ORDER — POLYETHYLENE GLYCOL 3350 17 G PO PACK: 17.0000 g | PACK | Freq: Every day | ORAL | Status: DC | PRN

## 2024-03-26 MED ORDER — ONDANSETRON HCL 4 MG PO TABS: 4.0000 mg | ORAL_TABLET | Freq: Four times a day (QID) | ORAL | Status: DC | PRN

## 2024-03-26 MED ORDER — HEPARIN BOLUS VIA INFUSION
4500.0000 [IU] | Freq: Once | INTRAVENOUS | Status: AC
  Administered 2024-03-26: 4500 [IU] via INTRAVENOUS

## 2024-03-26 MED ORDER — IOHEXOL 350 MG/ML SOLN
75.0000 mL | Freq: Once | INTRAVENOUS | Status: AC | PRN
Start: 2024-03-26 — End: 2024-03-26
  Administered 2024-03-26: 75 mL via INTRAVENOUS

## 2024-03-26 MED ORDER — ACETAMINOPHEN 500 MG PO TABS
1000.0000 mg | ORAL_TABLET | Freq: Once | ORAL | Status: DC
  Filled 2024-03-26: qty 2

## 2024-03-26 NOTE — ED Triage Notes (Signed)
 Pt comes POV for left leg pain. States she slept with compression socks on and this am it was hurting. She was advised to come to ED for possible clot. Not diabetic. Hx of HTN.

## 2024-03-26 NOTE — Plan of Care (Signed)

## 2024-03-26 NOTE — Consult Note (Signed)
 Pharmacy Consult Note - Anticoagulation  Pharmacy Consult for heparin Indication: pulmonary embolus Allergies  Allergen Reactions   Naproxen  Diarrhea   Niacin And Related Other (See Comments)    Redness, burning sensation    PATIENT MEASUREMENTS: Height: 5' 3 (160 cm) Weight: 78 kg (172 lb) IBW/kg (Calculated) : 52.4 HEPARIN DW (KG): 69.3  VITAL SIGNS: Temp: 98 F (36.7 C) (11/02 0828) Temp Source: Oral (11/02 0828) BP: 118/49 (11/02 1200) Pulse Rate: 75 (11/02 1200)  Recent Labs    03/26/24 0845  HGB 13.1  HCT 38.9  PLT 183  CREATININE 1.07*    Estimated Creatinine Clearance: 38 mL/min (A) (by C-G formula based on SCr of 1.07 mg/dL (H)).  PAST MEDICAL HISTORY: Past Medical History:  Diagnosis Date   Aneurysm    Hypercholesteremia     Medications:  (Not in a hospital admission)  Scheduled:   acetaminophen   1,000 mg Oral Once   heparin  4,500 Units Intravenous Once   Infusions:   heparin      ASSESSMENT: 88 y.o. female with PMH cerebral aneurysm s/p clip, TIA and chronic back pain  is presenting with PE. Patient is not on chronic anticoagulation per chart review. Pharmacy has been consulted to initiate and manage heparin intravenous infusion.   Goal(s) of therapy: Heparin level 0.3 - 0.7 units/mL aPTT 66 - 102 seconds Monitor platelets by anticoagulation protocol: Yes   Baseline anticoagulation labs: Recent Labs    03/26/24 0845  HGB 13.1  PLT 183    Baseline aPTT and INR pending  PLAN:  Give 4500 units bolus x1; then start heparin infusion at 1200 units/hour.  Check heparin level in 8 hours after start of infusion, then daily once at least two levels are consecutively therapeutic.  Monitor CBC daily while on heparin infusion.   Annabella LOISE Banks, PharmD Clinical Pharmacist 03/26/2024 4:14 PM

## 2024-03-26 NOTE — ED Notes (Signed)
 Pt says her pain in her left calf is sharp pain. Pts leg does not feel warm and is not red.

## 2024-03-26 NOTE — ED Notes (Signed)
 Patient transported to CT

## 2024-03-26 NOTE — ED Provider Notes (Addendum)
 [name] AT [facility] Acute Care Provider Note   CSN: [id number] Arrival date & time: 03/26/24  9198     Patient presents with: Leg Pain   Teresa Simon is a 88 y.o. female.   HPI     88 y.o. female with medical history significant for hypertension.  Patient presented to the ED with complaints of left leg pain that started about 2 days ago.  Patient has had intermittent swelling for the past several weeks for which she has been wearing compression stockings.  Recent road trips to [place] -at least 2 trips, the last 1 of which was about 3 weeks ago, 3-hour drive to [place]  and back on the same day, totaling 6 hours on the road that day, also reports she lost her husband in August of this year and since then she has been sitting and sleeping in the recliner with her legs down.  She reports intermittent mid chest pains, which has been attributed to GERD in the past, but worsened recently. She reports watery stools over the past week about 4 times a day, she had 4 episodes yesterday, she reports stools were dark.  Prior to Admission medications   Medication Sig Start Date End Date Taking? Authorizing Provider  APIXABAN WINN) VTE STARTER PACK (10MG  AND 5MG ) Take as directed on package: start with two-5mg  tablets twice daily for 7 days. On day 8, switch to one-5mg  tablet twice daily. 03/28/24  Yes Sigdel, Santosh, MD  Coenzyme Q10 100 MG capsule Take 100 mg by mouth daily.   Yes [provider]  Cyanocobalamin (VITAMIN B-12 SL) Place 1 tablet under the tongue daily.   Yes [provider]  famotidine (PEPCID) 20 MG tablet Take 1 tablet (20 mg total) by mouth 2 (two) times daily. Patient taking differently: Take 20 mg by mouth daily as needed for heartburn or indigestion. 03/21/24  Yes Tobie Suzzane POUR, MD  ibuprofen (ADVIL) 200 MG tablet Take 400 mg by mouth every 6 (six) hours as needed for mild pain (pain score 1-3).   Yes [provider]  Misc Natural Products (OSTEO BI-FLEX ADV JOINT SHIELD PO) Take 2 capsules by mouth daily.   Yes [provider]  olmesartan  (BENICAR ) 5 MG tablet Take 1 tablet (5 mg total) by mouth daily. 03/21/24  Yes Tobie Suzzane POUR, MD  OVER THE COUNTER MEDICATION Take 1 capsule by mouth in the morning and at bedtime. Memory supplement   Yes [provider]  oxyCODONE -acetaminophen  (PERCOCET) 5-325 MG tablet Take 1 tablet by mouth every 6 (six) hours as needed for severe pain (pain score 7-10). 03/11/23  Yes Mesner, Selinda, MD  simvastatin  (ZOCOR ) 20 MG tablet Take 1 tablet (20 mg total) by mouth at bedtime. 03/21/24  Yes Tobie Suzzane POUR, MD  VITAMIN E PO Take 1 tablet by mouth daily.   Yes [provider]  metoprolol succinate (TOPROL-XL) 25 MG 24 hr tablet Take 1 tablet (25 mg total) by mouth daily. 03/29/24   Sigdel, Santosh, MD  Specialty Vitamins Products (MM BIOTIN/KERATIN) CAPS Take 2 capsules by mouth daily.    [provider]    Allergies: Naproxen , Tylenol  [acetaminophen ], and Niacin and related    Review of Systems  Updated Vital Signs BP (!) 131/56 (BP Location: Left Arm)   Pulse 88   Temp 98.6 F (37 C) (Oral)   Resp 18   Ht 5' 3 (1.6 m)   Wt 75.4 kg   SpO2 96%   BMI  29.46 kg/m   Physical Exam Vitals and nursing note reviewed.  Constitutional:      Appearance: She is well-developed.  HENT:     Head: Atraumatic.  Cardiovascular:     Rate and Rhythm: Tachycardia present.  Pulmonary:     Effort: Pulmonary effort is normal.  Musculoskeletal:        General: Tenderness present.     Cervical back: Normal range of motion and neck supple.     Left lower leg: Edema present.  Skin:    General: Skin is warm and dry.  Neurological:     Mental Status: She is alert and oriented to person, place, and time.     (all labs ordered are listed, but only abnormal results are displayed) Labs Reviewed  BASIC METABOLIC PANEL WITH GFR - Abnormal;  Notable for the following components:      Result Value   Glucose, Bld 113 (*)    Creatinine, Ser 1.07 (*)    GFR, Estimated 51 (*)    All other components within normal limits  CBC WITH DIFFERENTIAL/PLATELET - Abnormal; Notable for the following components:   WBC 12.7 (*)    Eosinophils Absolute 3.0 (*)    All other components within normal limits  PRO BRAIN NATRIURETIC PEPTIDE - Abnormal; Notable for the following components:   Pro Brain Natriuretic Peptide 461.0 (*)    All other components within normal limits  PROTIME-INR - Abnormal; Notable for the following components:   Prothrombin Time 15.5 (*)    All other components within normal limits  BASIC METABOLIC PANEL WITH GFR - Abnormal; Notable for the following components:   Glucose, Bld 113 (*)    All other components within normal limits  CBC - Abnormal; Notable for the following components:   RBC 3.77 (*)    Hemoglobin 11.6 (*)    HCT 34.3 (*)    All other components within normal limits  TROPONIN T, HIGH SENSITIVITY - Abnormal; Notable for the following components:   Troponin T High Sensitivity 24 (*)    All other components within normal limits  TROPONIN T, HIGH SENSITIVITY - Abnormal; Notable for the following components:   Troponin T High Sensitivity 24 (*)    All other components within normal limits  GASTROINTESTINAL PANEL BY PCR, STOOL (REPLACES STOOL CULTURE)  PATHOLOGIST SMEAR REVIEW  APTT  HEPARIN LEVEL (UNFRACTIONATED)  CBC  MAGNESIUM  HEPARIN LEVEL (UNFRACTIONATED)  HEPARIN LEVEL (UNFRACTIONATED)    EKG: EKG Interpretation Date/Time:  Sunday March 26 2024 16:59:52 EST Ventricular Rate:  82 PR Interval:  172 QRS Duration:  140 QT Interval:  500 QTC Calculation: 585 R Axis:   113  Text Interpretation: Sinus rhythm Consider left atrial enlargement Nonspecific intraventricular conduction delay Abnormal lateral Q waves Probable anteroseptal infarct, recent Confirmed by Charlyn Sora [phone]) on  03/27/2024 5:21:33 PM  Radiology: No results found.    .Critical Care  Performed by: Charlyn Sora, MD Authorized by: Charlyn Sora, MD   Critical care provider statement:    Critical care time (minutes):  37   Critical care was time spent personally by me on the following activities:  Development of treatment plan with patient or surrogate, discussions with consultants, evaluation of patient's response to treatment, examination of patient, ordering and review of laboratory studies, ordering and review of radiographic studies, ordering and performing treatments and interventions, pulse oximetry, re-evaluation of patient's condition and review of old charts    Medications Ordered in the ED  lactated ringers infusion (  Intravenous New Bag/Given 03/26/24 2014)  perflutren lipid microspheres (DEFINITY) IV suspension (3 mLs Intravenous Given 03/27/24 0910)  iohexol  (OMNIPAQUE ) 350 MG/ML injection 75 mL (75 mLs Intravenous Contrast Given 03/26/24 1535)  heparin bolus via infusion 4,500 Units (4,500 Units Intravenous Bolus from Bag 03/26/24 1701)  oxyCODONE  (Oxy IR/ROXICODONE ) immediate release tablet 5 mg (5 mg Oral Given 03/27/24 0254)  simvastatin  (ZOCOR ) tablet 20 mg (20 mg Oral Given 03/28/24 0415)                                    Medical Decision Making Amount and/or Complexity of Data Reviewed Labs: ordered. Radiology: ordered.  Risk OTC drugs. Prescription drug management. Decision regarding hospitalization.   88 year old female comes in with chief complaint of left lower extremity swelling.  Differential diagnosis considered for this patient includes DVT, superficial thrombophlebitis, cellulitis.  Ultrasound DVT ordered, it is positive for DVT.  Upon reassessment, family states that patient has complained of some chest discomfort.  Patient also indicates the same.  PCP had thought that patient might have had GERD.  She has also had experiences of shortness of breath and  chest pain, but they have thought that this could be because of her grieving from the loss of the dog recently.  Patient has been under a lot of stress after her husband has passed away few months ago.  During that reassessment, I noted that patient's O2 sats was jumping between 93 to 95% even when she was not speaking.  I ordered CT PE.  It shows bilateral PE.  There is RV to LV ratio of 1.4. Hemodynamically patient is stable at this time.  She denies any chest pain, shortness of breath, weakness, near fainting recently.  There is no hypoxia.  I will start heparin, but I will also add biomarkers to see if it gives additional information.  She is stable at this point.  However if the biomarkers are rising or patient is getting worse, then we have a baseline to compare.  Of note, patient has previous history of brain bleed and coiling.  This was done about 18 years ago.  I did discuss the case with vascular surgery, they indicated that patient is safe to get anticoagulation.  They recommended that patient be given follow-up with DVT clinic for optimal DOAC management.  I will consult medicine for admission.  Final diagnoses:  Acute pulmonary embolism with acute cor pulmonale, unspecified pulmonary embolism type (HCC)  Acute deep vein thrombosis (DVT) of proximal vein of left lower extremity [facility])    ED Discharge Orders          Ordered    metoprolol succinate (TOPROL-XL) 25 MG 24 hr tablet  Daily        03/28/24 1127    APIXABAN (ELIQUIS) VTE STARTER PACK (10MG  AND 5MG )       Note to Pharmacy: If starter pack unavailable, substitute with seventy-four 5 mg apixaban tabs following the above SIG directions.   03/28/24 1127    Increase activity slowly        03/28/24 1127    Diet - low sodium heart healthy        03/28/24 1127    Discharge instructions       Comments: 1.  Start taking Eliquis 10 mg twice daily for 7 days followed by 5 mg twice daily.  Recommend at least for 3 to 6 months.  Follow-up with PCP for continuation. 2.  Return to the ER if chest pain, shortness of breath or increasing leg pain.   03/28/24 1127               Charlyn Sora, MD 03/27/24 1721    Charlyn Sora, MD 04/02/24 2030

## 2024-03-26 NOTE — ED Notes (Signed)
 Patient transported to Ultrasound

## 2024-03-26 NOTE — H&P (Signed)
 History and Physical    Teresa Simon FMW:[id number] DOB: [birth year] DOA: 03/26/2024  PCP: Teresa Suzzane POUR, MD   Patient coming from: Home  I have personally briefly reviewed patient's old medical records in [facility]  Chief Complaint: Left leg pain  HPI: Teresa Simon is a 88 y.o. female with medical history significant for hypertension.  Patient presented to the ED with complaints of left leg pain that started about 2 days ago.  Patient has had intermittent swelling for the past several weeks for which she has been wearing compression stockings.  Recent road trips to [place] -at least 2 trips, the last 1 of which was about 3 weeks ago, 3-hour drive to [place]  and back on the same day, totaling 6 hours on the road that day, also reports she lost her husband in August of this year and since then she has been sitting and sleeping in the recliner with her legs down.  She reports intermittent mid chest pains, which has been attributed to GERD in the past, but worsened recently. She reports watery stools over the past week about 4 times a day, she had 4 episodes yesterday, she reports stools were dark.  ED Course: Stable vitals.  O2 sats 91 to 93% on room air WBC 12.7.  Hemoglobin 13. Left lower extremity venous ultrasound with occlusive DVT within the left femoral and left peroneal veins. CTA chest bilateral PE, with right heart strain. IV heparin started.  Review of Systems: As per HPI all other systems reviewed and negative.  Past Medical History:  Diagnosis Date   Aneurysm    Hypercholesteremia     Past Surgical History:  Procedure Laterality Date   CEREBRAL ANEURYSM REPAIR       reports that she has never smoked. She has never used smokeless tobacco. She reports that she does not drink alcohol and does not use drugs.  Allergies  Allergen Reactions   Naproxen  Diarrhea   Niacin And Related Other (See Comments)    Redness, burning sensation    Family History   Problem Relation Age of Onset   Obesity Sister    Cancer Sister        breast   Kidney disease Sister        dialysis   COPD Sister    Stroke Brother    Diabetes Brother    Heart disease Brother    Thyroid  disease Daughter    Arthritis Daughter     Prior to Admission medications   Medication Sig Start Date End Date Taking? Authorizing Provider  acetaminophen  (TYLENOL ) 500 MG tablet Take 500 mg by mouth every 6 (six) hours as needed for mild pain (pain score 1-3).    [provider]  Coenzyme Q10 100 MG capsule Take 100 mg by mouth daily.    [provider]  famotidine (PEPCID) 20 MG tablet Take 1 tablet (20 mg total) by mouth 2 (two) times daily. 03/21/24   Teresa Suzzane POUR, MD  mirabegron  ER (MYRBETRIQ ) 50 MG TB24 tablet Take 1 tablet (50 mg total) by mouth daily. 05/20/23   Teresa Suzzane POUR, MD  Misc Natural Products (OSTEO BI-FLEX ADV JOINT SHIELD PO) Take 2 capsules by mouth daily.    [provider]  olmesartan  (BENICAR ) 5 MG tablet Take 1 tablet (5 mg total) by mouth daily. 03/21/24   Teresa Suzzane POUR, MD  oxyCODONE -acetaminophen  (PERCOCET) 5-325 MG tablet Take 1 tablet by mouth every 6 (six) hours as needed for severe pain (pain score  7-10). 03/11/23   Simon, Selinda, MD  simvastatin  (ZOCOR ) 20 MG tablet Take 1 tablet (20 mg total) by mouth at bedtime. 03/21/24   Teresa Suzzane POUR, MD  Specialty Vitamins Products (MM BIOTIN/KERATIN) CAPS Take 2 capsules by mouth daily.    [provider]  VITAMIN E PO Take 1 tablet by mouth daily.    [provider]    Physical Exam: Vitals:   03/26/24 0930 03/26/24 1000 03/26/24 1015 03/26/24 1200  BP: (!) 120/48 (!) 117/44 127/67 (!) 118/49  Pulse: 79 79 86 75  Resp:      Temp:      TempSrc:      SpO2: 98% 98% 98% 95%  Weight:      Height:        Constitutional: NAD, calm, comfortable Vitals:   03/26/24 0930 03/26/24 1000 03/26/24 1015 03/26/24 1200  BP: (!) 120/48 (!) 117/44 127/67 (!)  118/49  Pulse: 79 79 86 75  Resp:      Temp:      TempSrc:      SpO2: 98% 98% 98% 95%  Weight:      Height:       Eyes: PERRL, lids and conjunctivae normal ENMT: Mucous membranes are moist.   Neck: normal, supple, no masses, no thyromegaly Respiratory: clear to auscultation bilaterally, no wheezing, no crackles. Normal respiratory effort. No accessory muscle use.  Cardiovascular: Regular rate and rhythm, no murmurs / rubs / gallops.  Trace left lower extremity edema. Abdomen: no tenderness, no masses palpated. No hepatosplenomegaly. Bowel sounds positive.  Musculoskeletal: no clubbing / cyanosis. No joint deformity upper and lower extremities.  Skin: no rashes, lesions, ulcers. No induration Neurologic: No facial asymmetry, moves extremity spontaneously, speech fluent.  Psychiatric: Normal judgment and insight. Alert and oriented x 3. Normal mood.   Labs on Admission: I have personally reviewed following labs and imaging studies  CBC: Recent Labs  Lab 03/26/24 0845  WBC 12.7*  NEUTROABS 5.8  HGB 13.1  HCT 38.9  MCV 92.6  PLT 183   Basic Metabolic Panel: Recent Labs  Lab 03/26/24 0845  NA 142  K 4.0  CL 104  CO2 26  GLUCOSE 113*  BUN 13  CREATININE 1.07*  CALCIUM 9.4   Radiological Exams on Admission: CT Angio Chest PE W and/or Wo Contrast Result Date: 03/26/2024 EXAM: CTA of the Chest with contrast for PE 03/26/2024 03:44:54 PM TECHNIQUE: CTA of the chest was performed without and with the administration of 75 mL of iohexol  (OMNIPAQUE ) 350 MG/ML injection. Multiplanar reformatted images are provided for review. MIP images are provided for review. Automated exposure control, iterative reconstruction, and/or weight based adjustment of the mA/kV was utilized to reduce the radiation dose to as low as reasonably achievable. COMPARISON: 03/10/2023 CLINICAL HISTORY: FINDINGS: PULMONARY ARTERIES: Pulmonary arteries are adequately opacified for evaluation. Bilateral pulmonary  emboli are noted. Main pulmonary artery is normal in caliber. MEDIASTINUM: RV/LV ratio of 1.4 is noted suggesting right heart strain. Aortic atherosclerosis. LYMPH NODES: No mediastinal, hilar or axillary lymphadenopathy. LUNGS AND PLEURA: Mild biapical scarring is noted. Stable 5 mm left lower lobe nodule is noted best seen on imaging of 84 of series 10. No focal consolidation or pulmonary edema. No pleural effusion or pneumothorax. UPPER ABDOMEN: Limited images of the upper abdomen are unremarkable. SOFT TISSUES AND BONES: No acute bone or soft tissue abnormality. IMPRESSION: 1. Bilateral pulmonary emboli. This finding was reported to Dr. Nanavati at 4 pm on 03/26/2024 . 2.  Right heart strain with RV/LV ratio of 1.4. 3. Stable 5 mm left lower lobe pulmonary nodule. No routine follow-up imaging is recommended per Fleischner Society Guidelines for a single solid nodule 5 mm in patients without specified high-risk factors. Electronically signed by: Lynwood Seip MD 03/26/2024 04:01 PM EST RP Workstation: [id number]   EKG: Independently reviewed.  Sinus rhythm, rate 82, QTc 585.  Assessment/Plan Principal Problem:   Pulmonary embolism (HCC) Active Problems:   Essential hypertension   Prediabetes   DVT (deep venous thrombosis) (HCC)  Assessment and Plan: No notes have been filed under this hospital service. Service: Hospitalist  PE/DVT-2 recent road trips to [place]  and back, last trip was 3 weeks ago.  She reports the swelling of her lower extremity preceded her road trips.  Left lower extremity venous ultrasound positive for occlusive DVT within the left femoral and left peroneal veins.  CTA chest-bilateral PE with right heart strain.  Blood pressure stable systolic 110s to 859d, O2 sats 91 to 93% on room air.  She reports dark stools over the past week, hemoglobin is stable. -Continue heparin drip - Troponin 24, trend -BNP 461 - Echocardiogram  Diarrhea-over the past week, blood pressure,  renal function, electrolytes stable.  Reports dark stools, hemoglobin stable. -Stool FOBT - Stool C. difficile, GI pathogen panel - LR 75cc/hr x 12hrs  Prolonged QT-585  - Potassium 4, check magnesium   Prediabetes-A1c 12/02/2023-6.2.  DVT prophylaxis: Heparin Code Status: Full code, confirmed with patient and her other daughter at bedside. Family Communication: Daughter at bedside.  Patient's daughter Geoffry is one of our nurses on the floor here at [facility]. Disposition Plan: ~ 2 days Consults called: None Admission status:  Inpt tele  I certify that at the point of admission it is my clinical judgment that the patient will require inpatient hospital care spanning beyond 2 midnights from the point of admission due to high intensity of service, high risk for further deterioration and high frequency of surveillance required.    Author: Tully FORBES Carwin, MD 03/26/2024 6:21 PM  For on call review [email or web address].

## 2024-03-27 ENCOUNTER — Other Ambulatory Visit (HOSPITAL_COMMUNITY): Payer: Self-pay | Admitting: *Deleted

## 2024-03-27 ENCOUNTER — Inpatient Hospital Stay (HOSPITAL_COMMUNITY)

## 2024-03-27 DIAGNOSIS — I2699 Other pulmonary embolism without acute cor pulmonale: Secondary | ICD-10-CM | POA: Diagnosis not present

## 2024-03-27 DIAGNOSIS — I2609 Other pulmonary embolism with acute cor pulmonale: Secondary | ICD-10-CM

## 2024-03-27 LAB — BASIC METABOLIC PANEL WITH GFR
Anion gap: 7 (ref 5–15)
BUN: 11 mg/dL (ref 8–23)
CO2: 28 mmol/L (ref 22–32)
Calcium: 9 mg/dL (ref 8.9–10.3)
Chloride: 104 mmol/L (ref 98–111)
Creatinine, Ser: 0.87 mg/dL (ref 0.44–1.00)
GFR, Estimated: >60 mL/min (ref >60)
Glucose, Bld: 113 mg/dL — ABNORMAL HIGH (ref 70–99)
Potassium: 3.9 mmol/L (ref 3.5–5.1)
Sodium: 139 mmol/L (ref 135–145)

## 2024-03-27 LAB — CBC
HCT: 37.4 % (ref 36.0–46.0)
Hemoglobin: 12.4 g/dL (ref 12.0–15.0)
MCH: 29.1 pg (ref 26.0–34.0)
MCHC: 33.2 g/dL (ref 30.0–36.0)
MCV: 87.8 fL (ref 80.0–100.0)
Platelets: 194 K/uL (ref 150–400)
RBC: 4.26 MIL/uL (ref 3.87–5.11)
RDW: 13.2 % (ref 11.5–15.5)
WBC: 7.6 K/uL (ref 4.0–10.5)
nRBC: 0 % (ref 0.0–0.2)

## 2024-03-27 LAB — ECHOCARDIOGRAM COMPLETE
Area-P 1/2: 6.32 cm2
Height: 63 in
S' Lateral: 2.6 cm
Weight: 2660.8 [oz_av]

## 2024-03-27 LAB — GASTROINTESTINAL PANEL BY PCR, STOOL (REPLACES STOOL CULTURE)

## 2024-03-27 LAB — HEPARIN LEVEL (UNFRACTIONATED)
Heparin Unfractionated: 0.54 [IU]/mL (ref 0.30–0.70)
Heparin Unfractionated: 0.62 [IU]/mL (ref 0.30–0.70)

## 2024-03-27 MED ORDER — METOPROLOL SUCCINATE ER 25 MG PO TB24
25.0000 mg | ORAL_TABLET | Freq: Every day | ORAL | Status: DC
Start: 2024-03-27 — End: 2024-03-28
  Administered 2024-03-27 – 2024-03-28 (×2): 25 mg via ORAL
  Filled 2024-03-27 (×2): qty 1

## 2024-03-27 MED ORDER — OXYCODONE HCL 5 MG PO TABS
5.0000 mg | ORAL_TABLET | Freq: Once | ORAL | Status: AC | PRN
  Administered 2024-03-27: 5 mg via ORAL
  Filled 2024-03-27: qty 1

## 2024-03-27 MED ORDER — PERFLUTREN LIPID MICROSPHERE
1.0000 mL | INTRAVENOUS | Status: AC | PRN
  Administered 2024-03-27: 3 mL via INTRAVENOUS

## 2024-03-27 MED ORDER — ENSURE PLUS HIGH PROTEIN PO LIQD: 237.0000 mL | Freq: Three times a day (TID) | ORAL | Status: DC

## 2024-03-27 MED ORDER — METOPROLOL SUCCINATE ER 25 MG PO TB24: 12.5000 mg | ORAL_TABLET | Freq: Every day | ORAL | Status: DC

## 2024-03-27 MED ORDER — ADULT MULTIVITAMIN W/MINERALS CH
1.0000 | ORAL_TABLET | Freq: Every day | ORAL | Status: DC
  Administered 2024-03-27 – 2024-03-28 (×2): 1 via ORAL
  Filled 2024-03-27 (×2): qty 1

## 2024-03-27 MED ORDER — TRAMADOL HCL 50 MG PO TABS
50.0000 mg | ORAL_TABLET | Freq: Four times a day (QID) | ORAL | Status: DC | PRN
  Administered 2024-03-27 (×2): 50 mg via ORAL
  Filled 2024-03-27 (×2): qty 1

## 2024-03-27 NOTE — Progress Notes (Signed)
 Tele just called this nurse and stated at 1501 patient had a 22 beat run of SVT. Patient asymptomatic and in bed at this time. Dr. Sigdel made aware, no new orders

## 2024-03-27 NOTE — Consult Note (Signed)
 Pharmacy Consult Note - Anticoagulation  Pharmacy Consult for heparin Indication: pulmonary embolus Brief A/P: Heparin level within goal range Continue Heparin at current rate   Allergies  Allergen Reactions   Naproxen  Diarrhea   Tylenol  [Acetaminophen ] Other (See Comments)    Generalized abdominal pain   Niacin And Related Other (See Comments)    Redness, burning sensation    PATIENT MEASUREMENTS: Height: 5' 3 (160 cm) Weight: 75.4 kg (166 lb 4.8 oz) IBW/kg (Calculated) : 52.4 HEPARIN DW (KG): 69.3  VITAL SIGNS: Temp: 99 F (37.2 C) (11/03 0530) Temp Source: Oral (11/03 0530) BP: 136/52 (11/03 0530) Pulse Rate: 92 (11/03 0530)  Recent Labs    03/26/24 1628 03/27/24 0119 03/27/24 0120 03/27/24 0933  HGB  --  12.4  --   --   HCT  --  37.4  --   --   PLT  --  194  --   --   APTT 31  --   --   --   LABPROT 15.5*  --   --   --   INR 1.2  --   --   --   HEPARINUNFRC  --   --    < > 0.62  CREATININE  --  0.87  --   --    < > = values in this interval not displayed.    Estimated Creatinine Clearance: 46 mL/min (by C-G formula based on SCr of 0.87 mg/dL).   ASSESSMENT: 88 y.o. female with PE for heparin  Goal(s) of therapy: Heparin level 0.3 - 0.7 units/mL aPTT 66 - 102 seconds Monitor platelets by anticoagulation protocol: Yes   Baseline anticoagulation labs: Recent Labs    03/26/24 0845 03/26/24 1628 03/27/24 0119  APTT  --  31  --   INR  --  1.2  --   HGB 13.1  --  12.4  PLT 183  --  194    PLAN: Continue heparin infusion at 1200 units/hr Monitor heparin level daily Continue to monitor H&H and platelets  Elspeth Sour, PharmD Clinical Pharmacist 03/27/2024 10:50 AM

## 2024-03-27 NOTE — Progress Notes (Signed)
 tele reported NSVT 22 beats.  Patient asymptomatic. Will start metoprolol 12.5 mg daily and uptitrate. Echocardiogram showed normal left ventricular function with normal right ventricular size and function. Will continue IV heparin for PE/dvt

## 2024-03-27 NOTE — Plan of Care (Signed)

## 2024-03-27 NOTE — Progress Notes (Signed)
 Transition of Care Department [facility]) has reviewed patient and no other TOC needs have been identified at this time. We will continue to monitor patient advancement through interdisciplinary progression rounds. If new patient transition needs arise, please place a TOC consult.   03/27/24 0751  TOC Brief Assessment  Insurance and Status Reviewed  Patient has primary care physician Yes  Home environment has been reviewed Lives alone, but grandson stays with her often.  Prior level of function: Independent.  Prior/Current Home Services No current home services  Social Drivers of Health Review SDOH reviewed no interventions necessary  Readmission risk has been reviewed Yes  Transition of care needs no transition of care needs at this time

## 2024-03-27 NOTE — Progress Notes (Signed)
 PROGRESS NOTE    Teresa Simon  FMW:[id number] DOB: 03/17/1936 DOA: 03/26/2024 PCP: Tobie Suzzane POUR, MD   Brief Narrative:   Teresa Simon is a 88 y.o. female with medical history significant for hypertension.  Patient presented to the ED with complaints of left leg pain that started about 2 days ago.  Patient has had intermittent swelling for the past several weeks for which she has been wearing compression stockings.  Recent road trips. She reports watery stools over the past week about 4 times a day, she had 4 episodes yesterday, she reports stools were dark.   ED Course: Stable vitals.  O2 sats 91 to 93% on room air WBC 12.7.  Hemoglobin 13. Left lower extremity venous ultrasound with occlusive DVT within the left femoral and left peroneal veins. CTA chest bilateral PE, with right heart strain. IV heparin started. Admitted for further management   Assessment & Plan:   Principal Problem:   Pulmonary embolism (HCC) Active Problems:   Essential hypertension   Prediabetes   DVT (deep venous thrombosis) (HCC)   PE/DVT- Likely provoked, recent road trips Bilateral PE, right heart strain per CT.  Vital signs have been stable.  Patient is on room air, will wait for echocardiogram. - Troponin 24, flat -BNP 461 - Will continue IV heparin today, plan to switch to p.o. anticoagulations like Eliquis tomorrow.   Diarrhea-over the past week, blood pressure, renal function, electrolytes stable.  Reports dark stools, hemoglobin stable. -Stool FOBT - Stool C. difficile, GI pathogen panel pending.   Prolonged QT-585  - Potassium 4, check magnesium    Prediabetes-A1c 12/02/2023-6.2.   DVT prophylaxis: Heparin Code Status: Full code,  Family Communication: no family at the bedside Disposition Plan: ~ 1 d Consults called: None Admission status:  Inpt tele  I certify that at the point of admission it is my clinical judgment that the patient will require inpatient hospital care  spanning beyond 2 midnights from the point of admission due to high intensity of service, high risk for further deterioration and high frequency of surveillance required.    Subjective:  Patient seen and examined at the bedside.  She reports of left leg pain as well as pain in the right shoulder/back.  Vital signs are stable.  No fever or chills.  Objective: Vitals:   03/26/24 1949 03/27/24 0132 03/27/24 0530 03/27/24 1221  BP: (!) 144/52 138/70 (!) 136/52 128/65  Pulse: 94 96 92 86  Resp:  16 18 20   Temp: 97.7 F (36.5 C) 98.3 F (36.8 C) 99 F (37.2 C) 98.4 F (36.9 C)  TempSrc: Oral Oral Oral Oral  SpO2: 93% 92% 90% 92%  Weight:      Height:       No intake or output data in the 24 hours ending 03/27/24 1345 Filed Weights   03/26/24 0815 03/26/24 1735  Weight: 78 kg 75.4 kg    Examination:  General exam: Appears calm and comfortable  Respiratory system: Bilateral decreased breath sounds at bases Cardiovascular system: S1 & S2 heard, Rate controlled Gastrointestinal system: Abdomen is nondistended, soft and nontender. Normal bowel sounds heard. Extremities: Mild edema left lower extremity, mild tenderness on calf.   Central nervous system: Alert and oriented. No focal neurological deficits. Moving extremities Skin: No rashes, lesions or ulcers Psychiatry: Judgement and insight appear normal. Mood & affect appropriate.     Data Reviewed: I have personally reviewed following labs and imaging studies  CBC: Recent Labs  Lab 03/26/24 0845 03/27/24  0119  WBC 12.7* 7.6  NEUTROABS 5.8  --   HGB 13.1 12.4  HCT 38.9 37.4  MCV 92.6 87.8  PLT 183 194   Basic Metabolic Panel: Recent Labs  Lab 03/26/24 0845 03/26/24 1116 03/27/24 0119  NA 142  --  139  K 4.0  --  3.9  CL 104  --  104  CO2 26  --  28  GLUCOSE 113*  --  113*  BUN 13  --  11  CREATININE 1.07*  --  0.87  CALCIUM 9.4  --  9.0  MG  --  1.9  --    GFR: Estimated Creatinine Clearance: 46 mL/min (by  C-G formula based on SCr of 0.87 mg/dL). Liver Function Tests: No results for input(s): AST, ALT, ALKPHOS, BILITOT, PROT, ALBUMIN in the last 168 hours. No results for input(s): LIPASE, AMYLASE in the last 168 hours. No results for input(s): AMMONIA in the last 168 hours. Coagulation Profile: Recent Labs  Lab 03/26/24 1628  INR 1.2   Cardiac Enzymes: No results for input(s): CKTOTAL, CKMB, CKMBINDEX, TROPONINI in the last 168 hours. BNP (last 3 results) Recent Labs    03/26/24 1628  PROBNP 461.0*   HbA1C: No results for input(s): HGBA1C in the last 72 hours. CBG: No results for input(s): GLUCAP in the last 168 hours. Lipid Profile: No results for input(s): CHOL, HDL, LDLCALC, TRIG, CHOLHDL, LDLDIRECT in the last 72 hours. Thyroid  Function Tests: No results for input(s): TSH, T4TOTAL, FREET4, T3FREE, THYROIDAB in the last 72 hours. Anemia Panel: No results for input(s): VITAMINB12, FOLATE, FERRITIN, TIBC, IRON, RETICCTPCT in the last 72 hours. Sepsis Labs: No results for input(s): PROCALCITON, LATICACIDVEN in the last 168 hours.  Recent Results (from the past 240 hours)  Gastrointestinal Panel by PCR , Stool     Status: None   Collection Time: 03/26/24  8:58 PM   Specimen: Stool  Result Value Ref Range Status   Campylobacter species NOT DETECTED NOT DETECTED Final   Plesimonas shigelloides NOT DETECTED NOT DETECTED Final   Salmonella species NOT DETECTED NOT DETECTED Final   Yersinia enterocolitica NOT DETECTED NOT DETECTED Final   Vibrio species NOT DETECTED NOT DETECTED Final   Vibrio cholerae NOT DETECTED NOT DETECTED Final   Enteroaggregative E coli (EAEC) NOT DETECTED NOT DETECTED Final   Enteropathogenic E coli (EPEC) NOT DETECTED NOT DETECTED Final   Enterotoxigenic E coli (ETEC) NOT DETECTED NOT DETECTED Final   Shiga like toxin producing E coli (STEC) NOT DETECTED NOT DETECTED Final    Shigella/Enteroinvasive E coli (EIEC) NOT DETECTED NOT DETECTED Final   Cryptosporidium NOT DETECTED NOT DETECTED Final   Cyclospora cayetanensis NOT DETECTED NOT DETECTED Final   Entamoeba histolytica NOT DETECTED NOT DETECTED Final   Giardia lamblia NOT DETECTED NOT DETECTED Final   Adenovirus F40/41 NOT DETECTED NOT DETECTED Final   Astrovirus NOT DETECTED NOT DETECTED Final   Norovirus GI/GII NOT DETECTED NOT DETECTED Final   Rotavirus A NOT DETECTED NOT DETECTED Final   Sapovirus (I, II, IV, and V) NOT DETECTED NOT DETECTED Final    Comment: Performed at [facility] [address] [facility], [address], [place], [place] [address]         Radiology Studies: ECHOCARDIOGRAM COMPLETE Result Date: 03/27/2024    ECHOCARDIOGRAM REPORT   Patient Name:   STEVEY STAPLETON Date of Exam: 03/27/2024 Medical Rec #:  [id number]        Height:       63.0 in Accession #:  [id number]       Weight:       166.3 lb Date of Birth:  [birth year]       BSA:          1.788 m Patient Age:    87 years         BP:           136/52 mmHg Patient Gender: F                HR:           82 bpm. Exam Location:  [facility] Procedure: 2D Echo, Cardiac Doppler, Color Doppler and Intracardiac            Opacification Agent (Both Spectral and Color Flow Doppler were            utilized during procedure). Indications:    Pulmonary Embolus l26.09  History:        Patient has prior history of Echocardiogram examinations, most                 recent 07/22/2015. TIA and Stroke; Risk Factors:Hypertension,                 Dyslipidemia and Prediabetes. DVT (deep venous thrombosis)                 (HCC). Pulmonary embolism (HCC).  Sonographer:    Aida Pizza RCS Referring Phys: [phone] EJIROGHENE E EMOKPAE IMPRESSIONS  1. Left ventricular ejection fraction, by estimation, is 55 to 60%. The left ventricle has normal function. The left ventricle has no regional wall motion abnormalities. There is moderate asymmetric left ventricular hypertrophy of the  basal segment. Indeterminate diastolic filling due to E-A fusion.  2. Right ventricular systolic function is normal. The right ventricular size is normal. There is mildly elevated pulmonary artery systolic pressure. The estimated right ventricular systolic pressure is 37.3 mmHg.  3. The mitral valve is degenerative. Trivial mitral valve regurgitation.  4. The aortic valve is tricuspid. Aortic valve regurgitation is not visualized. Aortic valve sclerosis/calcification is present, without any evidence of aortic stenosis.  5. The inferior vena cava is normal in size with greater than 50% respiratory variability, suggesting right atrial pressure of 3 mmHg. Comparison(s): No prior Echocardiogram. FINDINGS  Left Ventricle: Left ventricular ejection fraction, by estimation, is 55 to 60%. The left ventricle has normal function. The left ventricle has no regional wall motion abnormalities. Definity contrast agent was given IV to delineate the left ventricular  endocardial borders. The left ventricular internal cavity size was normal in size. There is moderate asymmetric left ventricular hypertrophy of the basal segment. Abnormal (paradoxical) septal motion, consistent with left bundle branch block. Indeterminate diastolic filling due to E-A fusion. Right Ventricle: The right ventricular size is normal. No increase in right ventricular wall thickness. Right ventricular systolic function is normal. There is mildly elevated pulmonary artery systolic pressure. The tricuspid regurgitant velocity is 2.93  m/s, and with an assumed right atrial pressure of 3 mmHg, the estimated right ventricular systolic pressure is 37.3 mmHg. Left Atrium: Left atrial size was normal in size. Right Atrium: Right atrial size was normal in size. Pericardium: There is no evidence of pericardial effusion. Presence of epicardial fat layer. Mitral Valve: The mitral valve is degenerative in appearance. Trivial mitral valve regurgitation. Tricuspid Valve:  The tricuspid valve is grossly normal. Tricuspid valve regurgitation is mild. Aortic Valve: The aortic valve is tricuspid. There is mild aortic valve annular calcification. Aortic  valve regurgitation is not visualized. Aortic valve sclerosis/calcification is present, without any evidence of aortic stenosis. Pulmonic Valve: The pulmonic valve was not well visualized. Pulmonic valve regurgitation is trivial. Aorta: The aortic root is normal in size and structure. Venous: The inferior vena cava is normal in size with greater than 50% respiratory variability, suggesting right atrial pressure of 3 mmHg. IAS/Shunts: No atrial level shunt detected by color flow Doppler. Additional Comments: 3D was performed not requiring image post processing on an independent workstation and was indeterminate.  LEFT VENTRICLE PLAX 2D LVIDd:         4.20 cm LVIDs:         2.60 cm LV PW:         1.10 cm LV IVS:        1.30 cm LVOT diam:     1.90 cm LV SV:         64 LV SV Index:   36 LVOT Area:     2.84 cm  RIGHT VENTRICLE RV S prime:     18.30 cm/s TAPSE (M-mode): 2.5 cm LEFT ATRIUM             Index        RIGHT ATRIUM           Index LA diam:        3.20 cm 1.79 cm/m   RA Area:     13.10 cm LA Vol (A2C):   43.0 ml 24.05 ml/m  RA Volume:   31.60 ml  17.68 ml/m LA Vol (A4C):   42.4 ml 23.72 ml/m LA Biplane Vol: 43.6 ml 24.39 ml/m  AORTIC VALVE LVOT Vmax:   106.00 cm/s LVOT Vmean:  74.200 cm/s LVOT VTI:    0.226 m  AORTA Ao Root diam: 3.50 cm MITRAL VALVE                TRICUSPID VALVE MV Area (PHT): 6.32 cm     TR Peak grad:   34.3 mmHg MV Decel Time: 120 msec     TR Vmax:        293.00 cm/s MV E velocity: 130.00 cm/s                             SHUNTS                             Systemic VTI:  0.23 m                             Systemic Diam: 1.90 cm Jayson Sierras MD Electronically signed by Jayson Sierras MD Signature Date/Time: 03/27/2024/12:18:00 PM    Final    US  Venous Img Lower Unilateral Left Result Date:  03/26/2024 CLINICAL DATA:  Left calf pain and edema EXAM: LEFT LOWER EXTREMITY VENOUS DOPPLER ULTRASOUND TECHNIQUE: Gray-scale sonography with graded compression, as well as color Doppler and duplex ultrasound were performed to evaluate the lower extremity deep venous systems from the level of the common femoral vein and including the common femoral, femoral, profunda femoral, popliteal and calf veins including the posterior tibial, peroneal and gastrocnemius veins when visible. The superficial great saphenous vein was also interrogated. Spectral Doppler was utilized to evaluate flow at rest and with distal augmentation maneuvers in the common femoral, femoral and popliteal veins. COMPARISON:  None Available. FINDINGS: Contralateral Common Femoral  Vein: Respiratory phasicity is normal and symmetric with the symptomatic side. No evidence of thrombus. Normal compressibility. Common Femoral Vein: No evidence of thrombus. Normal compressibility, respiratory phasicity and response to augmentation. Saphenofemoral Junction: No evidence of thrombus. Normal compressibility and flow on color Doppler imaging. Profunda Femoral Vein: No evidence of thrombus. Normal compressibility and flow on color Doppler imaging. Femoral Vein: There is occlusive thrombus within the mid to distal left femoral vein. The vessel is noncompressible with no internal Doppler flow identified in this region. The proximal left femoral vein is patent. Popliteal Vein: No evidence of thrombus. Normal compressibility, respiratory phasicity and response to augmentation. Calf Veins: There is occlusive thrombus within the left peroneal vein. The posterior tibial vein is patent. Superficial Great Saphenous Vein: No evidence of thrombus. Normal compressibility. Other Findings:  None. IMPRESSION: 1. Occlusive DVT within the left femoral and left peroneal veins. These results will be called to the ordering clinician or representative by the Radiologist Assistant,  and communication documented in the PACS or [employer]. Electronically Signed   By: Ozell Daring M.D.   On: 03/26/2024 18:06   CT Angio Chest PE W and/or Wo Contrast Result Date: 03/26/2024 EXAM: CTA of the Chest with contrast for PE 03/26/2024 03:44:54 PM TECHNIQUE: CTA of the chest was performed without and with the administration of 75 mL of iohexol  (OMNIPAQUE ) 350 MG/ML injection. Multiplanar reformatted images are provided for review. MIP images are provided for review. Automated exposure control, iterative reconstruction, and/or weight based adjustment of the mA/kV was utilized to reduce the radiation dose to as low as reasonably achievable. COMPARISON: 03/10/2023 CLINICAL HISTORY: FINDINGS: PULMONARY ARTERIES: Pulmonary arteries are adequately opacified for evaluation. Bilateral pulmonary emboli are noted. Main pulmonary artery is normal in caliber. MEDIASTINUM: RV/LV ratio of 1.4 is noted suggesting right heart strain. Aortic atherosclerosis. LYMPH NODES: No mediastinal, hilar or axillary lymphadenopathy. LUNGS AND PLEURA: Mild biapical scarring is noted. Stable 5 mm left lower lobe nodule is noted best seen on imaging of 84 of series 10. No focal consolidation or pulmonary edema. No pleural effusion or pneumothorax. UPPER ABDOMEN: Limited images of the upper abdomen are unremarkable. SOFT TISSUES AND BONES: No acute bone or soft tissue abnormality. IMPRESSION: 1. Bilateral pulmonary emboli. This finding was reported to Dr. Nanavati at 4 pm on 03/26/2024 . 2. Right heart strain with RV/LV ratio of 1.4. 3. Stable 5 mm left lower lobe pulmonary nodule. No routine follow-up imaging is recommended per Fleischner Society Guidelines for a single solid nodule 5 mm in patients without specified high-risk factors. Electronically signed by: Lynwood Seip MD 03/26/2024 04:01 PM EST RP Workstation: [id number]        Scheduled Meds:  acetaminophen   1,000 mg Oral Once   feeding supplement  237 mL Oral  TID BM   multivitamin with minerals  1 tablet Oral Daily   Continuous Infusions:  heparin 1,200 Units/hr (03/27/24 1041)          Quantisha Marsicano, MD [facility] 03/27/2024, 1:45 PM

## 2024-03-27 NOTE — Consult Note (Signed)
 Pharmacy Consult Note - Anticoagulation  Pharmacy Consult for heparin Indication: pulmonary embolus Brief A/P: Heparin level within goal range Continue Heparin at current rate   Allergies  Allergen Reactions   Naproxen  Diarrhea   Tylenol  [Acetaminophen ] Other (See Comments)    Generalized abdominal pain   Niacin And Related Other (See Comments)    Redness, burning sensation    PATIENT MEASUREMENTS: Height: 5' 3 (160 cm) Weight: 75.4 kg (166 lb 4.8 oz) IBW/kg (Calculated) : 52.4 HEPARIN DW (KG): 69.3  VITAL SIGNS: Temp: 98.3 F (36.8 C) (11/03 0132) Temp Source: Oral (11/03 0132) BP: 138/70 (11/03 0132) Pulse Rate: 96 (11/03 0132)  Recent Labs    03/26/24 1628 03/27/24 0119 03/27/24 0120  HGB  --  12.4  --   HCT  --  37.4  --   PLT  --  194  --   APTT 31  --   --   LABPROT 15.5*  --   --   INR 1.2  --   --   HEPARINUNFRC  --   --  0.54  CREATININE  --  0.87  --     Estimated Creatinine Clearance: 46 mL/min (by C-G formula based on SCr of 0.87 mg/dL).   ASSESSMENT: 88 y.o. female with PE for heparin  Goal(s) of therapy: Heparin level 0.3 - 0.7 units/mL aPTT 66 - 102 seconds Monitor platelets by anticoagulation protocol: Yes   Baseline anticoagulation labs: Recent Labs    03/26/24 0845 03/26/24 1628 03/27/24 0119  APTT  --  31  --   INR  --  1.2  --   HGB 13.1  --  12.4  PLT 183  --  194    PLAN: No change to heparin  Check heparin level in 6 hours to verify  Cathlyn Arrant, PharmD, BCPS

## 2024-03-27 NOTE — Progress Notes (Signed)
 Initial Nutrition Assessment  DOCUMENTATION CODES:   Non-severe (moderate) malnutrition in context of social or environmental circumstances  INTERVENTION:   Ensure Plus High Protein po TID, each supplement provides 350 kcal and 20 grams of protein  Magic cup TID with meals, each supplement provides 290 kcal and 9 grams of protein  MVI with minerals daily  NUTRITION DIAGNOSIS:   Moderate Malnutrition related to social / environmental circumstances as evidenced by mild fat depletion, mild muscle depletion.  GOAL:   Patient will meet greater than or equal to 90% of their needs  MONITOR:   PO intake, Supplement acceptance  REASON FOR ASSESSMENT:   Malnutrition Screening Tool    ASSESSMENT:   88 yo female admitted with pulmonary embolism. PMH includes HLD, aneurysm.  Patient reports poor appetite and decreased intake since her husband passed away in 2024-01-09. She has had a lot of significant events recently and now feels run down. She usually eats cereal for breakfast and a sandwich later in the day. She drinks water and green tea throughout the day. She has some Ensure and Boost supplements at home, but forgets to drink them. She has recently been dealing with a lot of diarrhea which caused her to eat less. She has had intermittent swelling for the past several weeks. She weighed over 180 lbs earlier in the year, now down to 166 lbs. Discussed with patient ways to increase calorie and protein intake at home.   Weight history reviewed in EMR. 10% weight loss noted over the past 6 months. Suspect edema is masking actual dry weight loss and further muscle depletions.   Labs and medications reviewed.   Patient meets criteria for moderate malnutrition, given mild depletion of muscle and subcutaneous fat mass.  NUTRITION - FOCUSED PHYSICAL EXAM:  Flowsheet Row Most Recent Value  Orbital Region Mild depletion  Upper Arm Region Mild depletion  Thoracic and Lumbar Region No depletion   Buccal Region Mild depletion  Temple Region Moderate depletion  Clavicle Bone Region Mild depletion  Clavicle and Acromion Bone Region Mild depletion  Scapular Bone Region Moderate depletion  Dorsal Hand Mild depletion  Patellar Region Mild depletion  Anterior Thigh Region Mild depletion  Posterior Calf Region Mild depletion  Edema (RD Assessment) Moderate  [LLE]  Hair Other (Comment)  [thin]  Eyes Reviewed  Mouth Reviewed  Skin Reviewed  Nails Reviewed    Diet Order:   Diet Order             Diet Heart Room service appropriate? Yes; Fluid consistency: Thin  Diet effective now                   EDUCATION NEEDS:   Education needs have been addressed  Skin:  Skin Assessment: Reviewed RN Assessment  Last BM:  11/2  Height:   Ht Readings from Last 1 Encounters:  03/26/24 5' 3 (1.6 m)    Weight:   Wt Readings from Last 1 Encounters:  03/26/24 75.4 kg    Ideal Body Weight:  52.3 kg  BMI:  Body mass index is 29.46 kg/m.  Estimated Nutritional Needs:   Kcal:  1600-1800  Protein:  80-90 gm  Fluid:  1.6 L   Suzen HUNT RD, LDN, CNSC Contact via secure chat. If unavailable, use group chat RD Inpatient.

## 2024-03-27 NOTE — Progress Notes (Signed)
*  PRELIMINARY RESULTS* Echocardiogram 2D Echocardiogram has been performed with Definity.  Teresa Simon 03/27/2024, 9:34 AM

## 2024-03-28 ENCOUNTER — Telehealth (HOSPITAL_COMMUNITY): Payer: Self-pay | Admitting: Pharmacy Technician

## 2024-03-28 ENCOUNTER — Other Ambulatory Visit (HOSPITAL_COMMUNITY): Payer: Self-pay

## 2024-03-28 DIAGNOSIS — E440 Moderate protein-calorie malnutrition: Secondary | ICD-10-CM | POA: Diagnosis present

## 2024-03-28 DIAGNOSIS — I2699 Other pulmonary embolism without acute cor pulmonale: Secondary | ICD-10-CM | POA: Diagnosis not present

## 2024-03-28 LAB — CBC
HCT: 34.3 % — ABNORMAL LOW (ref 36.0–46.0)
Hemoglobin: 11.6 g/dL — ABNORMAL LOW (ref 12.0–15.0)
MCH: 30.8 pg (ref 26.0–34.0)
MCHC: 33.8 g/dL (ref 30.0–36.0)
MCV: 91 fL (ref 80.0–100.0)
Platelets: 163 K/uL (ref 150–400)
RBC: 3.77 MIL/uL — ABNORMAL LOW (ref 3.87–5.11)
RDW: 12.4 % (ref 11.5–15.5)
WBC: 8.5 K/uL (ref 4.0–10.5)
nRBC: 0 % (ref 0.0–0.2)

## 2024-03-28 LAB — PATHOLOGIST SMEAR REVIEW: Path Review: NEGATIVE

## 2024-03-28 LAB — HEPARIN LEVEL (UNFRACTIONATED): Heparin Unfractionated: 0.54 [IU]/mL (ref 0.30–0.70)

## 2024-03-28 MED ORDER — APIXABAN 5 MG PO TABS: 10.0000 mg | ORAL_TABLET | Freq: Two times a day (BID) | ORAL | Status: DC

## 2024-03-28 MED ORDER — APIXABAN (ELIQUIS) VTE STARTER PACK (10MG AND 5MG): ORAL_TABLET | ORAL | 0 refills | Status: AC

## 2024-03-28 MED ORDER — APIXABAN 5 MG PO TABS
10.0000 mg | ORAL_TABLET | Freq: Two times a day (BID) | ORAL | Status: DC
  Administered 2024-03-28: 10 mg via ORAL
  Filled 2024-03-28: qty 2

## 2024-03-28 MED ORDER — METOPROLOL SUCCINATE ER 25 MG PO TB24: 25.0000 mg | ORAL_TABLET | Freq: Every day | ORAL | 0 refills | Status: DC

## 2024-03-28 MED ORDER — SIMVASTATIN 20 MG PO TABS: 20.0000 mg | ORAL_TABLET | Freq: Every day | ORAL | Status: DC

## 2024-03-28 MED ORDER — SIMVASTATIN 20 MG PO TABS
20.0000 mg | ORAL_TABLET | Freq: Once | ORAL | Status: AC
  Administered 2024-03-28: 20 mg via ORAL
  Filled 2024-03-28: qty 1

## 2024-03-28 MED ORDER — APIXABAN 5 MG PO TABS: 5.0000 mg | ORAL_TABLET | Freq: Two times a day (BID) | ORAL | Status: DC

## 2024-03-28 NOTE — Progress Notes (Signed)
 Mobility Specialist Progress Note:    03/28/24 1110  Mobility  Activity Ambulated with assistance  Level of Assistance Contact guard assist, steadying assist  Assistive Device None  Distance Ambulated (ft) 480 ft  Range of Motion/Exercises Active;All extremities  Activity Response Tolerated well  Mobility Referral Yes  Mobility visit 1 Mobility  Mobility Specialist Start Time (ACUTE ONLY) 1110  Mobility Specialist Stop Time (ACUTE ONLY) 1130  Mobility Specialist Time Calculation (min) (ACUTE ONLY) 20 min   Pt received in bed, agreeable to mobility. Required CGA to stand and ambulate with no AD. Tolerated well,asx throughout. Returned supine, family and RN in room. All needs met.  Shizue Kaseman Mobility Specialist Please contact via Special Educational Needs Teacher or  Rehab office at [phone]

## 2024-03-28 NOTE — Care Management Important Message (Signed)
 Important Message  Patient Details  Name: Teresa Simon MRN: [id number] Date of Birth: Oct 09, 1935   Important Message Given:  N/A - LOS <3 / Initial given by admissions     Brandom Kerwin L [name] 03/28/2024, 11:53 AM

## 2024-03-28 NOTE — Telephone Encounter (Signed)
 Patient Product/process development scientist completed.    The patient is insured through [employer]. Patient has Medicare and is not eligible for a copay card, but may be able to apply for patient assistance or Medicare RX Payment Plan (Patient Must reach out to their plan, if eligible for payment plan), if available.    Ran test claim for Eliquis  5 mg and the current 30 day co-pay is $47.00.   This test claim was processed through [facility]- copay amounts may vary at other pharmacies due to pharmacy/plan contracts, or as the patient moves through the different stages of their insurance plan.     Reyes Sharps, CPHT Pharmacy Technician Patient Advocate Specialist Lead [facility] Patient Advocate Team Direct Number: [phone]  Fax: [phone]

## 2024-03-28 NOTE — Consult Note (Signed)
 Pharmacy Consult Note - Anticoagulation  Pharmacy Consult for heparin Indication: pulmonary embolus  Allergies  Allergen Reactions   Naproxen  Diarrhea   Tylenol  [Acetaminophen ] Other (See Comments)    Generalized abdominal pain   Niacin And Related Other (See Comments)    Redness, burning sensation    PATIENT MEASUREMENTS: Height: 5' 3 (160 cm) Weight: 75.4 kg (166 lb 4.8 oz) IBW/kg (Calculated) : 52.4 HEPARIN DW (KG): 69.3  VITAL SIGNS: Temp: 97.9 F (36.6 C) (11/04 0454) Temp Source: Oral (11/04 0454) BP: 122/43 (11/04 0812) Pulse Rate: 77 (11/04 0812)  Recent Labs    03/26/24 1628 03/27/24 0119 03/27/24 0120 03/28/24 0446  HGB  --  12.4  --  11.6*  HCT  --  37.4  --  34.3*  PLT  --  194  --  163  APTT 31  --   --   --   LABPROT 15.5*  --   --   --   INR 1.2  --   --   --   HEPARINUNFRC  --   --    < > 0.54  CREATININE  --  0.87  --   --    < > = values in this interval not displayed.    Estimated Creatinine Clearance: 46 mL/min (by C-G formula based on SCr of 0.87 mg/dL).   ASSESSMENT: 88 y.o. female with PE started on IV heparin. Heparin level continues to be at goal this morning on 1200 units/hr. Slight trend downward in hemoglobin from 13 on admit to 11.6 this morning. No bleeding or IV issues noted.   Goal(s) of therapy: Heparin level 0.3 - 0.7 units/mL Monitor platelets by anticoagulation protocol: Yes   Baseline anticoagulation labs: Recent Labs    03/26/24 0845 03/26/24 1628 03/27/24 0119 03/28/24 0446  APTT  --  31  --   --   INR  --  1.2  --   --   HGB 13.1  --  12.4 11.6*  PLT 183  --  194 163    PLAN: Continue heparin infusion at 1200 units/hr Monitor heparin level daily Continue to monitor H&H and platelets  Dempsey Blush PharmD., BCPS Clinical Pharmacist 03/28/2024 8:43 AM

## 2024-03-28 NOTE — Discharge Instructions (Addendum)
 Information on my medicine - ELIQUIS (apixaban)  Why was Eliquis prescribed for you? Eliquis was prescribed to treat blood clots that may have been found in the veins of your legs (deep vein thrombosis) or in your lungs (pulmonary embolism) and to reduce the risk of them occurring again.  What do You need to know about Eliquis ? The starting dose is 10 mg (two 5 mg tablets) taken TWICE daily for the FIRST SEVEN (7) DAYS, then on 04/04/24  the dose is reduced to ONE 5 mg tablet taken TWICE daily.  Eliquis may be taken with or without food.   Try to take the dose about the same time in the morning and in the evening. If you have difficulty swallowing the tablet whole please discuss with your pharmacist how to take the medication safely.  Take Eliquis exactly as prescribed and DO NOT stop taking Eliquis without talking to the doctor who prescribed the medication.  Stopping may increase your risk of developing a new blood clot.  Refill your prescription before you run out.  After discharge, you should have regular check-up appointments with your healthcare provider that is prescribing your Eliquis.    What do you do if you miss a dose? If a dose of ELIQUIS is not taken at the scheduled time, take it as soon as possible on the same day and twice-daily administration should be resumed. The dose should not be doubled to make up for a missed dose.  Important Safety Information A possible side effect of Eliquis is bleeding. You should call your healthcare provider right away if you experience any of the following: Bleeding from an injury or your nose that does not stop. Unusual colored urine (red or dark brown) or unusual colored stools (red or black). Unusual bruising for unknown reasons. A serious fall or if you hit your head (even if there is no bleeding).  Some medicines may interact with Eliquis and might increase your risk of bleeding or clotting while on Eliquis. To help avoid this,  consult your healthcare provider or pharmacist prior to using any new prescription or non-prescription medications, including herbals, vitamins, non-steroidal anti-inflammatory drugs (NSAIDs) and supplements.  This website has more information on Eliquis (apixaban): http://[email or web address].com/eliquis/home

## 2024-03-28 NOTE — Plan of Care (Signed)

## 2024-03-28 NOTE — Discharge Summary (Addendum)
 Physician Discharge Summary  Teresa Simon FMW:[id number] DOB: Nov 26, 1935 DOA: 03/26/2024  PCP: Tobie Suzzane POUR, MD  Admit date: 03/26/2024 Discharge date: 03/28/2024  Admitted From: Home Disposition: Home  Recommendations for Outpatient Follow-up:  Follow up with PCP in 1 week  Follow up in ED if symptoms worsen or new appear   Home Health: No Equipment/Devices: None  Discharge Condition: Stable CODE STATUS: Full Diet recommendation: Heart healthy  Brief/Interim Summary:  Teresa Simon is a 88 y.o. female with medical history significant for hypertension.  Patient presented to the ED with complaints of left leg pain that started about 2 days ago.  Patient has had intermittent swelling for the past several weeks for which she has been wearing compression stockings.  Recent road trips. She reports watery stools over the past week about 4 times a day, she had 4 episodes yesterday, she reports stools were dark.   ED Course: Stable vitals.  O2 sats 91 to 93% on room air WBC 12.7.  Hemoglobin 13. Left lower extremity venous ultrasound with occlusive DVT within the left femoral and left peroneal veins. CTA chest bilateral PE, with right heart strain.  Patient was initiated on IV heparin and admitted for further management.   PE/DVT is likely provoked secondary to recent road trips.  She remained hemodynamically stable with some pain in the leg as well as some pain in her right back.  Echo showed normal left as well as right ventricular function.  No evidence of right heart strain.  Patient was subsequently discharged home with prescription of Eliquis. 10 mg twice daily for 7 days followed by 5 mg twice daily for 3 to 6 months.  Need to follow-up with PCP to discuss if she needs longer therapy.  Hospital course is notable for paroxysmal atrial tachycardia for which she is initiated on metoprolol 25 mg daily.  Needs follow-up with PCP in 1 week to ensure stable heart rate/BP     Prediabetes-A1c 12/02/2023-6.2.  Continue outpatient follow-up.  Discharge Diagnoses:  Principal Problem:   Pulmonary embolism (HCC) Active Problems:   Essential hypertension   Prediabetes   DVT (deep venous thrombosis) (HCC)   Protein-calorie malnutrition, moderate    Discharge Instructions  Discharge Instructions     Diet - low sodium heart healthy   Complete by: As directed    Discharge instructions   Complete by: As directed    1.  Start taking Eliquis 10 mg twice daily for 7 days followed by 5 mg twice daily.  Recommend at least for 3 to 6 months.  Follow-up with PCP for continuation. 2.  Return to the ER if chest pain, shortness of breath or increasing leg pain.   Increase activity slowly   Complete by: As directed       Allergies as of 03/28/2024       Reactions   Naproxen  Diarrhea   Tylenol  [acetaminophen ] Other (See Comments)   Generalized abdominal pain   Niacin And Related Other (See Comments)   Redness, burning sensation        Medication List     TAKE these medications    Apixaban Starter Pack (10mg  and 5mg ) Commonly known as: ELIQUIS STARTER PACK Take as directed on package: start with two-5mg  tablets twice daily for 7 days. On day 8, switch to one-5mg  tablet twice daily.   Coenzyme Q10 100 MG capsule Take 100 mg by mouth daily.   famotidine 20 MG tablet Commonly known as: Pepcid Take 1 tablet (20 mg total)  by mouth 2 (two) times daily. What changed:  when to take this reasons to take this   ibuprofen 200 MG tablet Commonly known as: ADVIL Take 400 mg by mouth every 6 (six) hours as needed for mild pain (pain score 1-3).   metoprolol succinate 25 MG 24 hr tablet Commonly known as: TOPROL-XL Take 1 tablet (25 mg total) by mouth daily. Start taking on: March 29, 2024   MM Biotin/Keratin Caps Take 2 capsules by mouth daily.   olmesartan  5 MG tablet Commonly known as: BENICAR  Take 1 tablet (5 mg total) by mouth daily.   OSTEO  BI-FLEX ADV JOINT SHIELD PO Take 2 capsules by mouth daily.   OVER THE COUNTER MEDICATION Take 1 capsule by mouth in the morning and at bedtime. Memory supplement   oxyCODONE -acetaminophen  5-325 MG tablet Commonly known as: Percocet Take 1 tablet by mouth every 6 (six) hours as needed for severe pain (pain score 7-10).   simvastatin  20 MG tablet Commonly known as: ZOCOR  Take 1 tablet (20 mg total) by mouth at bedtime.   VITAMIN B-12 SL Place 1 tablet under the tongue daily.   VITAMIN E PO Take 1 tablet by mouth daily.        Follow-up Information     Tobie Suzzane POUR, MD. Schedule an appointment as soon as possible for a visit in 1 week(s).   Specialty: Internal Medicine Contact information: [address] [place] [address] [phone]                Allergies  Allergen Reactions   Naproxen  Diarrhea   Tylenol  [Acetaminophen ] Other (See Comments)    Generalized abdominal pain   Niacin And Related Other (See Comments)    Redness, burning sensation    Consultations:    Procedures/Studies: ECHOCARDIOGRAM COMPLETE Result Date: 03/27/2024    ECHOCARDIOGRAM REPORT   Patient Name:   Teresa Simon Date of Exam: 03/27/2024 Medical Rec #:  [id number]        Height:       63.0 in Accession #:    [id number]       Weight:       166.3 lb Date of Birth:  [birth year]       BSA:          1.788 m Patient Age:    87 years         BP:           136/52 mmHg Patient Gender: F                HR:           82 bpm. Exam Location:  [facility] Procedure: 2D Echo, Cardiac Doppler, Color Doppler and Intracardiac            Opacification Agent (Both Spectral and Color Flow Doppler were            utilized during procedure). Indications:    Pulmonary Embolus l26.09  History:        Patient has prior history of Echocardiogram examinations, most                 recent 07/22/2015. TIA and Stroke; Risk Factors:Hypertension,                 Dyslipidemia and Prediabetes. DVT (deep venous  thrombosis)                 (HCC). Pulmonary embolism (HCC).  Sonographer:    Aida  White RCS Referring Phys: [id number] EJIROGHENE E EMOKPAE IMPRESSIONS  1. Left ventricular ejection fraction, by estimation, is 55 to 60%. The left ventricle has normal function. The left ventricle has no regional wall motion abnormalities. There is moderate asymmetric left ventricular hypertrophy of the basal segment. Indeterminate diastolic filling due to E-A fusion.  2. Right ventricular systolic function is normal. The right ventricular size is normal. There is mildly elevated pulmonary artery systolic pressure. The estimated right ventricular systolic pressure is 37.3 mmHg.  3. The mitral valve is degenerative. Trivial mitral valve regurgitation.  4. The aortic valve is tricuspid. Aortic valve regurgitation is not visualized. Aortic valve sclerosis/calcification is present, without any evidence of aortic stenosis.  5. The inferior vena cava is normal in size with greater than 50% respiratory variability, suggesting right atrial pressure of 3 mmHg. Comparison(s): No prior Echocardiogram. FINDINGS  Left Ventricle: Left ventricular ejection fraction, by estimation, is 55 to 60%. The left ventricle has normal function. The left ventricle has no regional wall motion abnormalities. Definity contrast agent was given IV to delineate the left ventricular  endocardial borders. The left ventricular internal cavity size was normal in size. There is moderate asymmetric left ventricular hypertrophy of the basal segment. Abnormal (paradoxical) septal motion, consistent with left bundle branch block. Indeterminate diastolic filling due to E-A fusion. Right Ventricle: The right ventricular size is normal. No increase in right ventricular wall thickness. Right ventricular systolic function is normal. There is mildly elevated pulmonary artery systolic pressure. The tricuspid regurgitant velocity is 2.93  m/s, and with an assumed right atrial pressure  of 3 mmHg, the estimated right ventricular systolic pressure is 37.3 mmHg. Left Atrium: Left atrial size was normal in size. Right Atrium: Right atrial size was normal in size. Pericardium: There is no evidence of pericardial effusion. Presence of epicardial fat layer. Mitral Valve: The mitral valve is degenerative in appearance. Trivial mitral valve regurgitation. Tricuspid Valve: The tricuspid valve is grossly normal. Tricuspid valve regurgitation is mild. Aortic Valve: The aortic valve is tricuspid. There is mild aortic valve annular calcification. Aortic valve regurgitation is not visualized. Aortic valve sclerosis/calcification is present, without any evidence of aortic stenosis. Pulmonic Valve: The pulmonic valve was not well visualized. Pulmonic valve regurgitation is trivial. Aorta: The aortic root is normal in size and structure. Venous: The inferior vena cava is normal in size with greater than 50% respiratory variability, suggesting right atrial pressure of 3 mmHg. IAS/Shunts: No atrial level shunt detected by color flow Doppler. Additional Comments: 3D was performed not requiring image post processing on an independent workstation and was indeterminate.  LEFT VENTRICLE PLAX 2D LVIDd:         4.20 cm LVIDs:         2.60 cm LV PW:         1.10 cm LV IVS:        1.30 cm LVOT diam:     1.90 cm LV SV:         64 LV SV Index:   36 LVOT Area:     2.84 cm  RIGHT VENTRICLE RV S prime:     18.30 cm/s TAPSE (M-mode): 2.5 cm LEFT ATRIUM             Index        RIGHT ATRIUM           Index LA diam:        3.20 cm 1.79 cm/m   RA Area:  13.10 cm LA Vol (A2C):   43.0 ml 24.05 ml/m  RA Volume:   31.60 ml  17.68 ml/m LA Vol (A4C):   42.4 ml 23.72 ml/m LA Biplane Vol: 43.6 ml 24.39 ml/m  AORTIC VALVE LVOT Vmax:   106.00 cm/s LVOT Vmean:  74.200 cm/s LVOT VTI:    0.226 m  AORTA Ao Root diam: 3.50 cm MITRAL VALVE                TRICUSPID VALVE MV Area (PHT): 6.32 cm     TR Peak grad:   34.3 mmHg MV Decel Time:  120 msec     TR Vmax:        293.00 cm/s MV E velocity: 130.00 cm/s                             SHUNTS                             Systemic VTI:  0.23 m                             Systemic Diam: 1.90 cm Jayson Sierras MD Electronically signed by Jayson Sierras MD Signature Date/Time: 03/27/2024/12:18:00 PM    Final    US  Venous Img Lower Unilateral Left Result Date: 03/26/2024 CLINICAL DATA:  Left calf pain and edema EXAM: LEFT LOWER EXTREMITY VENOUS DOPPLER ULTRASOUND TECHNIQUE: Gray-scale sonography with graded compression, as well as color Doppler and duplex ultrasound were performed to evaluate the lower extremity deep venous systems from the level of the common femoral vein and including the common femoral, femoral, profunda femoral, popliteal and calf veins including the posterior tibial, peroneal and gastrocnemius veins when visible. The superficial great saphenous vein was also interrogated. Spectral Doppler was utilized to evaluate flow at rest and with distal augmentation maneuvers in the common femoral, femoral and popliteal veins. COMPARISON:  None Available. FINDINGS: Contralateral Common Femoral Vein: Respiratory phasicity is normal and symmetric with the symptomatic side. No evidence of thrombus. Normal compressibility. Common Femoral Vein: No evidence of thrombus. Normal compressibility, respiratory phasicity and response to augmentation. Saphenofemoral Junction: No evidence of thrombus. Normal compressibility and flow on color Doppler imaging. Profunda Femoral Vein: No evidence of thrombus. Normal compressibility and flow on color Doppler imaging. Femoral Vein: There is occlusive thrombus within the mid to distal left femoral vein. The vessel is noncompressible with no internal Doppler flow identified in this region. The proximal left femoral vein is patent. Popliteal Vein: No evidence of thrombus. Normal compressibility, respiratory phasicity and response to augmentation. Calf Veins: There is  occlusive thrombus within the left peroneal vein. The posterior tibial vein is patent. Superficial Great Saphenous Vein: No evidence of thrombus. Normal compressibility. Other Findings:  None. IMPRESSION: 1. Occlusive DVT within the left femoral and left peroneal veins. These results will be called to the ordering clinician or representative by the Radiologist Assistant, and communication documented in the PACS or [employer]. Electronically Signed   By: Ozell Daring M.D.   On: 03/26/2024 18:06   CT Angio Chest PE W and/or Wo Contrast Result Date: 03/26/2024 EXAM: CTA of the Chest with contrast for PE 03/26/2024 03:44:54 PM TECHNIQUE: CTA of the chest was performed without and with the administration of 75 mL of iohexol  (OMNIPAQUE ) 350 MG/ML injection. Multiplanar reformatted images are provided for review.  MIP images are provided for review. Automated exposure control, iterative reconstruction, and/or weight based adjustment of the mA/kV was utilized to reduce the radiation dose to as low as reasonably achievable. COMPARISON: 03/10/2023 CLINICAL HISTORY: FINDINGS: PULMONARY ARTERIES: Pulmonary arteries are adequately opacified for evaluation. Bilateral pulmonary emboli are noted. Main pulmonary artery is normal in caliber. MEDIASTINUM: RV/LV ratio of 1.4 is noted suggesting right heart strain. Aortic atherosclerosis. LYMPH NODES: No mediastinal, hilar or axillary lymphadenopathy. LUNGS AND PLEURA: Mild biapical scarring is noted. Stable 5 mm left lower lobe nodule is noted best seen on imaging of 84 of series 10. No focal consolidation or pulmonary edema. No pleural effusion or pneumothorax. UPPER ABDOMEN: Limited images of the upper abdomen are unremarkable. SOFT TISSUES AND BONES: No acute bone or soft tissue abnormality. IMPRESSION: 1. Bilateral pulmonary emboli. This finding was reported to Dr. Nanavati at 4 pm on 03/26/2024 . 2. Right heart strain with RV/LV ratio of 1.4. 3. Stable 5 mm left lower  lobe pulmonary nodule. No routine follow-up imaging is recommended per Fleischner Society Guidelines for a single solid nodule 5 mm in patients without specified high-risk factors. Electronically signed by: Lynwood Seip MD 03/26/2024 04:01 PM EST RP Workstation: [id number]      Subjective:   Discharge Exam: Vitals:   03/28/24 0812 03/28/24 0905  BP: (!) 122/43 (!) 131/56  Pulse: 77 88  Resp:    Temp:  98.6 F (37 C)  SpO2:  96%    General: Pt is alert, awake, not in acute distress Cardiovascular: rate controlled, S1/S2 + Respiratory: bilateral decreased breath sounds at bases Abdominal: Soft, NT, ND, bowel sounds + Extremities: no edema, no cyanosis    The results of significant diagnostics from this hospitalization (including imaging, microbiology, ancillary and laboratory) are listed below for reference.     Microbiology: Recent Results (from the past 240 hours)  Gastrointestinal Panel by PCR , Stool     Status: None   Collection Time: 03/26/24  8:58 PM   Specimen: Stool  Result Value Ref Range Status   Campylobacter species NOT DETECTED NOT DETECTED Final   Plesimonas shigelloides NOT DETECTED NOT DETECTED Final   Salmonella species NOT DETECTED NOT DETECTED Final   Yersinia enterocolitica NOT DETECTED NOT DETECTED Final   Vibrio species NOT DETECTED NOT DETECTED Final   Vibrio cholerae NOT DETECTED NOT DETECTED Final   Enteroaggregative E coli (EAEC) NOT DETECTED NOT DETECTED Final   Enteropathogenic E coli (EPEC) NOT DETECTED NOT DETECTED Final   Enterotoxigenic E coli (ETEC) NOT DETECTED NOT DETECTED Final   Shiga like toxin producing E coli (STEC) NOT DETECTED NOT DETECTED Final   Shigella/Enteroinvasive E coli (EIEC) NOT DETECTED NOT DETECTED Final   Cryptosporidium NOT DETECTED NOT DETECTED Final   Cyclospora cayetanensis NOT DETECTED NOT DETECTED Final   Entamoeba histolytica NOT DETECTED NOT DETECTED Final   Giardia lamblia NOT DETECTED NOT DETECTED Final    Adenovirus F40/41 NOT DETECTED NOT DETECTED Final   Astrovirus NOT DETECTED NOT DETECTED Final   Norovirus GI/GII NOT DETECTED NOT DETECTED Final   Rotavirus A NOT DETECTED NOT DETECTED Final   Sapovirus (I, II, IV, and V) NOT DETECTED NOT DETECTED Final    Comment: Performed at [facility], [address] [place], [place] [address]     Labs: BNP (last 3 results) No results for input(s): BNP in the last 8760 hours. Basic Metabolic Panel: Recent Labs  Lab 03/26/24 0845 03/26/24 1116 03/27/24 0119  NA 142  --  139  K 4.0  --  3.9  CL 104  --  104  CO2 26  --  28  GLUCOSE 113*  --  113*  BUN 13  --  11  CREATININE 1.07*  --  0.87  CALCIUM 9.4  --  9.0  MG  --  1.9  --    Liver Function Tests: No results for input(s): AST, ALT, ALKPHOS, BILITOT, PROT, ALBUMIN in the last 168 hours. No results for input(s): LIPASE, AMYLASE in the last 168 hours. No results for input(s): AMMONIA in the last 168 hours. CBC: Recent Labs  Lab 03/26/24 0845 03/27/24 0119 03/28/24 0446  WBC 12.7* 7.6 8.5  NEUTROABS 5.8  --   --   HGB 13.1 12.4 11.6*  HCT 38.9 37.4 34.3*  MCV 92.6 87.8 91.0  PLT 183 194 163   Cardiac Enzymes: No results for input(s): CKTOTAL, CKMB, CKMBINDEX, TROPONINI in the last 168 hours. BNP: Invalid input(s): POCBNP CBG: No results for input(s): GLUCAP in the last 168 hours. D-Dimer No results for input(s): DDIMER in the last 72 hours. Hgb A1c No results for input(s): HGBA1C in the last 72 hours. Lipid Profile No results for input(s): CHOL, HDL, LDLCALC, TRIG, CHOLHDL, LDLDIRECT in the last 72 hours. Thyroid  function studies No results for input(s): TSH, T4TOTAL, T3FREE, THYROIDAB in the last 72 hours.  Invalid input(s): FREET3 Anemia work up No results for input(s): VITAMINB12, FOLATE, FERRITIN, TIBC, IRON, RETICCTPCT in the last 72 hours. Urinalysis    Component Value  Date/Time   COLORURINE YELLOW 07/21/2015 1442   APPEARANCEUR CLEAR 07/21/2015 1442   LABSPEC <1.005 (L) 07/21/2015 1442   PHURINE 6.0 07/21/2015 1442   GLUCOSEU NEGATIVE 07/21/2015 1442   HGBUR NEGATIVE 07/21/2015 1442   BILIRUBINUR negative 06/27/2020 1410   KETONESUR trace (5) (A) 06/27/2020 1410   KETONESUR NEGATIVE 07/21/2015 1442   PROTEINUR NEGATIVE 07/21/2015 1442   UROBILINOGEN 0.2 06/27/2020 1410   NITRITE Negative 06/27/2020 1410   NITRITE NEGATIVE 07/21/2015 1442   LEUKOCYTESUR Negative 06/27/2020 1410   Sepsis Labs Recent Labs  Lab 03/26/24 0845 03/27/24 0119 03/28/24 0446  WBC 12.7* 7.6 8.5   Microbiology Recent Results (from the past 240 hours)  Gastrointestinal Panel by PCR , Stool     Status: None   Collection Time: 03/26/24  8:58 PM   Specimen: Stool  Result Value Ref Range Status   Campylobacter species NOT DETECTED NOT DETECTED Final   Plesimonas shigelloides NOT DETECTED NOT DETECTED Final   Salmonella species NOT DETECTED NOT DETECTED Final   Yersinia enterocolitica NOT DETECTED NOT DETECTED Final   Vibrio species NOT DETECTED NOT DETECTED Final   Vibrio cholerae NOT DETECTED NOT DETECTED Final   Enteroaggregative E coli (EAEC) NOT DETECTED NOT DETECTED Final   Enteropathogenic E coli (EPEC) NOT DETECTED NOT DETECTED Final   Enterotoxigenic E coli (ETEC) NOT DETECTED NOT DETECTED Final   Shiga like toxin producing E coli (STEC) NOT DETECTED NOT DETECTED Final   Shigella/Enteroinvasive E coli (EIEC) NOT DETECTED NOT DETECTED Final   Cryptosporidium NOT DETECTED NOT DETECTED Final   Cyclospora cayetanensis NOT DETECTED NOT DETECTED Final   Entamoeba histolytica NOT DETECTED NOT DETECTED Final   Giardia lamblia NOT DETECTED NOT DETECTED Final   Adenovirus F40/41 NOT DETECTED NOT DETECTED Final   Astrovirus NOT DETECTED NOT DETECTED Final   Norovirus GI/GII NOT DETECTED NOT DETECTED Final   Rotavirus A NOT DETECTED NOT DETECTED Final   Sapovirus (I,  II, IV, and  V) NOT DETECTED NOT DETECTED Final    Comment: Performed at [facility], [address] [place], [place] [address]     Time coordinating discharge: 35 minutes  SIGNED:   Derryl Duval, MD  [facility] 03/28/2024, 4:44 PM

## 2024-03-29 ENCOUNTER — Telehealth: Payer: Self-pay

## 2024-03-29 NOTE — Transitions of Care (Post Inpatient/ED Visit) (Signed)
 03/29/2024  Name: Teresa Simon MRN: [id number] DOB: [birth year]  Today's TOC FU Call Status: Today's TOC FU Call Status:: Successful TOC FU Call Completed TOC FU Call Complete Date: 03/29/24 Patient's Name and Date of Birth confirmed.  Transition Care Management Follow-up Telephone Call Date of Discharge: 03/28/24 [facility] Type of Discharge: Inpatient Admission Primary Inpatient Discharge Diagnosis:: PE How have you been since you were released from the hospital?: Better Any questions or concerns?: No  Items Reviewed: Did you receive and understand the discharge instructions provided?: Yes Medications obtained,verified, and reconciled?: Yes (Medications Reviewed) Any new allergies since your discharge?: No Dietary orders reviewed?: Yes Do you have support at home?: Yes People in Home [RPT]: sibling(s)  Medications Reviewed Today: Medications Reviewed Today     Reviewed by Emmitt Pan, LPN (Licensed Practical Nurse) on 03/29/24 at 0930  Med List Status: <None>   Medication Order Taking? Sig Documenting Provider Last Dose Status Informant  APIXABAN (ELIQUIS) VTE STARTER PACK (10MG  AND 5MG ) [id number] Yes Take as directed on package: start with two-5mg  tablets twice daily for 7 days. On day 8, switch to one-5mg  tablet twice daily. Sigdel, Santosh, MD  Active   Coenzyme Q10 100 MG capsule [id number] Yes Take 100 mg by mouth daily. [provider]  Active Self, Pharmacy Records, Child  Cyanocobalamin (VITAMIN B-12 SL) [id number] Yes Place 1 tablet under the tongue daily. [provider]  Active Self, Pharmacy Records, Child  famotidine (PEPCID) 20 MG tablet [id number] Yes Take 1 tablet (20 mg total) by mouth 2 (two) times daily.  Patient taking differently: Take 20 mg by mouth daily as needed for heartburn or indigestion.   Tobie Suzzane POUR, MD  Active Self, Pharmacy Records, Child  ibuprofen (ADVIL) 200 MG tablet [id number] Yes Take  400 mg by mouth every 6 (six) hours as needed for mild pain (pain score 1-3). [provider]  Active Self, Pharmacy Records, Child  metoprolol succinate (TOPROL-XL) 25 MG 24 hr tablet [id number] Yes Take 1 tablet (25 mg total) by mouth daily. Sigdel, Santosh, MD  Active   Misc Natural Products (OSTEO BI-FLEX ADV JOINT SHIELD PO) [id number] Yes Take 2 capsules by mouth daily. [provider]  Active Self, Pharmacy Records, Child  olmesartan  (BENICAR ) 5 MG tablet [id number] Yes Take 1 tablet (5 mg total) by mouth daily. Tobie Suzzane POUR, MD  Active Self, Pharmacy Records, Child  OVER THE COUNTER MEDICATION [id number] Yes Take 1 capsule by mouth in the morning and at bedtime. Memory supplement [provider]  Active Self, Pharmacy Records, Child  oxyCODONE -acetaminophen  (PERCOCET) 5-325 MG tablet [id number] Yes Take 1 tablet by mouth every 6 (six) hours as needed for severe pain (pain score 7-10). Mesner, Selinda, MD  Active Self, Pharmacy Records, Child  simvastatin  (ZOCOR ) 20 MG tablet [id number] Yes Take 1 tablet (20 mg total) by mouth at bedtime. Tobie Suzzane POUR, MD  Active Self, Pharmacy Records, Child  Specialty Vitamins Products (MM BIOTIN/KERATIN) CAPS [id number] Yes Take 2 capsules by mouth daily. [provider]  Active Self, Pharmacy Records, Child  VITAMIN E PO [id number] Yes Take 1 tablet by mouth daily. [provider]  Active Self, Pharmacy Records, Child            Home Care and Equipment/Supplies: Were Home Health Services Ordered?: NA Any new equipment or medical supplies ordered?: NA  Functional Questionnaire: Do you need assistance with bathing/showering or dressing?: No Do you need assistance with meal preparation?: No Do  you need assistance with eating?: No Do you have difficulty maintaining continence: No Do you need assistance with getting out of bed/getting out of a chair/moving?: No Do you have difficulty managing or taking your  medications?: No  Follow up appointments reviewed: PCP Follow-up appointment confirmed?: Yes Date of PCP follow-up appointment?: 04/03/24 Follow-up Provider: [facility] Follow-up appointment confirmed?: NA Do you need transportation to your follow-up appointment?: No Do you understand care options if your condition(s) worsen?: Yes-patient verbalized understanding    SIGNATURE Julian Lemmings, LPN [facility] Nurse Health Advisor Direct Dial [phone]

## 2024-04-03 ENCOUNTER — Encounter: Payer: Self-pay | Admitting: Internal Medicine

## 2024-04-03 ENCOUNTER — Ambulatory Visit (INDEPENDENT_AMBULATORY_CARE_PROVIDER_SITE_OTHER): Admitting: Internal Medicine

## 2024-04-03 VITALS — BP 124/60 | HR 80 | Ht 64.0 in | Wt 170.4 lb

## 2024-04-03 DIAGNOSIS — Z09 Encounter for follow-up examination after completed treatment for conditions other than malignant neoplasm: Secondary | ICD-10-CM | POA: Diagnosis not present

## 2024-04-03 DIAGNOSIS — I4719 Other supraventricular tachycardia: Secondary | ICD-10-CM | POA: Diagnosis not present

## 2024-04-03 DIAGNOSIS — I2699 Other pulmonary embolism without acute cor pulmonale: Secondary | ICD-10-CM

## 2024-04-03 DIAGNOSIS — M170 Bilateral primary osteoarthritis of knee: Secondary | ICD-10-CM

## 2024-04-03 DIAGNOSIS — I82412 Acute embolism and thrombosis of left femoral vein: Secondary | ICD-10-CM | POA: Diagnosis not present

## 2024-04-03 MED ORDER — APIXABAN 5 MG PO TABS: 5.0000 mg | ORAL_TABLET | Freq: Two times a day (BID) | ORAL | 4 refills | Status: AC

## 2024-04-03 MED ORDER — TRAMADOL HCL 50 MG PO TABS: 50.0000 mg | ORAL_TABLET | Freq: Three times a day (TID) | ORAL | 0 refills | Status: AC | PRN

## 2024-04-03 NOTE — Assessment & Plan Note (Addendum)
 Her bilateral knee pain is likely due to OA of knee Has abdominal pain with Tylenol  arthritis Avoid oral NSAIDs due to her history of GERD/dysphagia and cerebral aneurysm, also on due to anticoagulation Tramadol as needed for severe pain Can use Biofreeze or Voltaren gel as needed Rest, ice and leg elevation as tolerated

## 2024-04-03 NOTE — Assessment & Plan Note (Signed)
 Recent hospitalization for DVT/PE Was initially started on Heparin drip, later transitioned to Eliquis

## 2024-04-03 NOTE — Assessment & Plan Note (Signed)
 Noted during recent hospitalization On Metoprolol 25 mg QD currently HR WNL today

## 2024-04-03 NOTE — Progress Notes (Signed)
 Established Patient Office Visit  Subjective:  Patient ID: Teresa Simon, female    DOB: May 10, 1936  Age: 88 y.o. MRN: [id number]  CC:  Chief Complaint  Patient presents with   Hospitalization Follow-up    Following up from hospital visit. D/c on 03/28/24, reports feeling well. Left leg pain still ongoing on and off.    HPI Teresa Simon is a 88 y.o. female with past medical history of HTN, cerebral aneurysm s/p clip, TIA and chronic back pain who presents for follow-up after recent hospitalization from 03/26/24-03/28/24.  Patient presented to the ED with complaints of left leg pain that started about 2 days ago. Patient has had intermittent swelling for the past several weeks for which she has been wearing compression stockings. Recent road trips.  She also reports being in the bed for few days due to URTI like symptoms in the prior weeks and feeling depressed due to her husband's death and her dog being treated for cancer.  In the ER, left lower extremity venous ultrasound with occlusive DVT within the left femoral and left peroneal veins. CTA chest bilateral PE, with right heart strain.  Patient was initiated on IV heparin and admitted for further management.  Patient was subsequently discharged home with prescription of Eliquis 10 mg twice daily for 7 days followed by 5 mg twice daily for 3 to 6 months.  Hospital course was also notable for paroxysmal atrial tachycardia for which she is initiated on metoprolol 25 mg daily. Her HR is wnl today.  She is tolerating Eliquis well.  Denies any major signs of bleeding or easy bruising.  She still reports intermittent left leg pain with swelling.  Of note, she also has a history of OA of knee. She has tried taking oxycodone  with adequate relief, but does not like to take it as it makes her drowsy.  She has abdominal pain with Tylenol .   Past Medical History:  Diagnosis Date   Aneurysm    Hypercholesteremia     Past Surgical History:   Procedure Laterality Date   CEREBRAL ANEURYSM REPAIR      Family History  Problem Relation Age of Onset   Obesity Sister    Cancer Sister        breast   Kidney disease Sister        dialysis   COPD Sister    Stroke Brother    Diabetes Brother    Heart disease Brother    Thyroid  disease Daughter    Arthritis Daughter     Social History   Socioeconomic History   Marital status: Married    Spouse name: Lynwood   Number of children: 2   Years of education: 12   Highest education level: High school graduate  Occupational History   Not on file  Tobacco Use   Smoking status: Never   Smokeless tobacco: Never  Vaping Use   Vaping status: Never Used  Substance and Sexual Activity   Alcohol use: No   Drug use: No   Sexual activity: Yes    Birth control/protection: Other-see comments  Other Topics Concern   Not on file  Social History Narrative   Not on file   Social Drivers of Health   Financial Resource Strain: Low Risk  (07/05/2023)   Overall Financial Resource Strain (CARDIA)    Difficulty of Paying Living Expenses: Not hard at all  Food Insecurity: No Food Insecurity (03/26/2024)   Hunger Vital Sign    Worried About Running  Out of Food in the Last Year: Never true    Ran Out of Food in the Last Year: Never true  Transportation Needs: Unmet Transportation Needs (03/26/2024)   PRAPARE - Transportation    Lack of Transportation (Medical): Yes    Lack of Transportation (Non-Medical): Yes  Physical Activity: Sufficiently Active (07/05/2023)   Exercise Vital Sign    Days of Exercise per Week: 7 days    Minutes of Exercise per Session: 30 min  Stress: No Stress Concern Present (07/05/2023)   [employer] of Occupational Health - Occupational Stress Questionnaire    Feeling of Stress : Not at all  Social Connections: Moderately Isolated (03/26/2024)   Social Connection and Isolation Panel    Frequency of Communication with Friends and Family: More than three times a  week    Frequency of Social Gatherings with Friends and Family: More than three times a week    Attends Religious Services: 1 to 4 times per year    Active Member of [employer] or Organizations: No    Attends Banker Meetings: Never    Marital Status: Widowed  Intimate Partner Violence: Not At Risk (03/26/2024)   Humiliation, Afraid, Rape, and Kick questionnaire    Fear of Current or Ex-Partner: No    Emotionally Abused: No    Physically Abused: No    Sexually Abused: No    Outpatient Medications Prior to Visit  Medication Sig Dispense Refill   APIXABAN (ELIQUIS) VTE STARTER PACK (10MG  AND 5MG ) Take as directed on package: start with two-5mg  tablets twice daily for 7 days. On day 8, switch to one-5mg  tablet twice daily. 74 each 0   Coenzyme Q10 100 MG capsule Take 100 mg by mouth daily.     Cyanocobalamin (VITAMIN B-12 SL) Place 1 tablet under the tongue daily.     famotidine (PEPCID) 20 MG tablet Take 1 tablet (20 mg total) by mouth 2 (two) times daily. 60 tablet 1   ibuprofen (ADVIL) 200 MG tablet Take 400 mg by mouth every 6 (six) hours as needed for mild pain (pain score 1-3).     metoprolol succinate (TOPROL-XL) 25 MG 24 hr tablet Take 1 tablet (25 mg total) by mouth daily. 30 tablet 0   Misc Natural Products (OSTEO BI-FLEX ADV JOINT SHIELD PO) Take 2 capsules by mouth daily.     olmesartan  (BENICAR ) 5 MG tablet Take 1 tablet (5 mg total) by mouth daily. 90 tablet 3   simvastatin  (ZOCOR ) 20 MG tablet Take 1 tablet (20 mg total) by mouth at bedtime. 90 tablet 3   UNABLE TO FIND Take 1 capsule by mouth 2 (two) times daily. Med Name: LIONS MANE MUSHROOM CAPSULE FOR MEMORY     VITAMIN E PO Take 1 tablet by mouth daily.     oxyCODONE -acetaminophen  (PERCOCET) 5-325 MG tablet Take 1 tablet by mouth every 6 (six) hours as needed for severe pain (pain score 7-10). 10 tablet 0   OVER THE COUNTER MEDICATION Take 1 capsule by mouth in the morning and at bedtime. Memory supplement      Specialty Vitamins Products (MM BIOTIN/KERATIN) CAPS Take 2 capsules by mouth daily.     No facility-administered medications prior to visit.    Allergies  Allergen Reactions   Naproxen  Diarrhea   Tylenol  [Acetaminophen ] Other (See Comments)    Generalized abdominal pain   Niacin And Related Other (See Comments)    Redness, burning sensation    ROS Review of Systems  Constitutional:  Negative for chills and fever.  HENT:  Negative for congestion, sinus pressure, sinus pain and sore throat.   Eyes:  Negative for pain and discharge.  Respiratory:  Negative for cough and shortness of breath.   Cardiovascular:  Positive for leg swelling. Negative for chest pain and palpitations.  Gastrointestinal:  Positive for constipation. Negative for diarrhea, nausea and vomiting.  Endocrine: Negative for polydipsia and polyuria.  Genitourinary:  Negative for dysuria, flank pain and hematuria.  Musculoskeletal:  Positive for back pain (Chronic). Negative for neck pain and neck stiffness.  Skin:  Negative for rash.  Neurological:  Negative for dizziness and weakness.  Psychiatric/Behavioral:  Positive for dysphoric mood and sleep disturbance. Negative for agitation and behavioral problems.       Objective:    Physical Exam Vitals reviewed.  Constitutional:      General: She is not in acute distress.    Appearance: She is not diaphoretic.  HENT:     Head: Normocephalic and atraumatic.     Nose: Nose normal. No congestion.     Mouth/Throat:     Mouth: Mucous membranes are moist.     Pharynx: No posterior oropharyngeal erythema.  Eyes:     General: No scleral icterus.    Extraocular Movements: Extraocular movements intact.  Cardiovascular:     Rate and Rhythm: Normal rate and regular rhythm.     Heart sounds: Normal heart sounds. No murmur heard. Pulmonary:     Breath sounds: Normal breath sounds. No wheezing or rales.  Abdominal:     Palpations: Abdomen is soft.     Tenderness:  There is abdominal tenderness (Epigastric).  Musculoskeletal:     Cervical back: Neck supple. No tenderness.     Lumbar back: No swelling or bony tenderness.     Right lower leg: Edema (Trace) present.     Left lower leg: Edema (1+) present.  Skin:    General: Skin is warm.     Findings: Lesion (Light brownish plaque over left temporal area - about 2 cm in diameter) present.  Neurological:     General: No focal deficit present.     Mental Status: She is alert and oriented to person, place, and time.     Sensory: No sensory deficit.     Motor: No weakness.  Psychiatric:        Mood and Affect: Mood is depressed. Affect is tearful.        Behavior: Behavior is cooperative.     BP 124/60 (BP Location: Left Arm)   Pulse 80   Ht 5' 4 (1.626 m)   Wt 170 lb 6.4 oz (77.3 kg)   SpO2 97%   BMI 29.25 kg/m  Wt Readings from Last 3 Encounters:  04/03/24 170 lb 6.4 oz (77.3 kg)  03/26/24 166 lb 4.8 oz (75.4 kg)  03/21/24 172 lb 6.4 oz (78.2 kg)    Lab Results  Component Value Date   TSH 0.675 05/20/2023   Lab Results  Component Value Date   WBC 8.5 03/28/2024   HGB 11.6 (L) 03/28/2024   HCT 34.3 (L) 03/28/2024   MCV 91.0 03/28/2024   PLT 163 03/28/2024   Lab Results  Component Value Date   NA 139 03/27/2024   K 3.9 03/27/2024   CO2 28 03/27/2024   GLUCOSE 113 (H) 03/27/2024   BUN 11 03/27/2024   CREATININE 0.87 03/27/2024   BILITOT 0.5 05/20/2023   ALKPHOS 50 05/20/2023   AST 28 05/20/2023  ALT 23 05/20/2023   PROT 6.4 05/20/2023   ALBUMIN 4.3 05/20/2023   CALCIUM 9.0 03/27/2024   ANIONGAP 7 03/27/2024   EGFR 62 12/02/2023   Lab Results  Component Value Date   CHOL 154 05/20/2023   Lab Results  Component Value Date   HDL 68 05/20/2023   Lab Results  Component Value Date   LDLCALC 67 05/20/2023   Lab Results  Component Value Date   TRIG 104 05/20/2023   Lab Results  Component Value Date   CHOLHDL 2.3 05/20/2023   Lab Results  Component Value Date    HGBA1C 6.2 (H) 12/02/2023      Assessment & Plan:   Problem List Items Addressed This Visit       Cardiovascular and Mediastinum   Pulmonary embolism (HCC)   Recent hospitalization for DVT/PE Was initially started on Heparin drip, later transitioned to Eliquis Likely provoked episode due to recent immobilization - continue Eliquis for 6 months (started in 11/25) Check CBC and BMP      Relevant Medications   apixaban (ELIQUIS) 5 MG TABS tablet (Start on 04/24/2024)   DVT (deep venous thrombosis) (HCC) - Primary   Recent hospitalization for DVT/PE Was initially started on Heparin drip, later transitioned to Eliquis      Relevant Medications   apixaban (ELIQUIS) 5 MG TABS tablet (Start on 04/24/2024)   Other Relevant Orders   CBC with Differential/Platelet   Basic Metabolic Panel (BMET)   Paroxysmal atrial tachycardia   Noted during recent hospitalization On Metoprolol 25 mg QD currently HR WNL today      Relevant Medications   apixaban (ELIQUIS) 5 MG TABS tablet (Start on 04/24/2024)     Musculoskeletal and Integument   Primary osteoarthritis of both knees   Her bilateral knee pain is likely due to OA of knee Has abdominal pain with Tylenol  arthritis Avoid oral NSAIDs due to her history of GERD/dysphagia and cerebral aneurysm, also on due to anticoagulation Tramadol as needed for severe pain Can use Biofreeze or Voltaren gel as needed Rest, ice and leg elevation as tolerated      Relevant Medications   traMADol (ULTRAM) 50 MG tablet     Other   Hospital discharge follow-up   Hospital chart reviewed, including discharge summary Medications reconciled and reviewed with the patient in detail      Relevant Orders   CBC with Differential/Platelet   Basic Metabolic Panel (BMET)    Meds ordered this encounter  Medications   traMADol (ULTRAM) 50 MG tablet    Sig: Take 1 tablet (50 mg total) by mouth every 8 (eight) hours as needed.    Dispense:  20 tablet     Refill:  0   apixaban (ELIQUIS) 5 MG TABS tablet    Sig: Take 1 tablet (5 mg total) by mouth 2 (two) times daily.    Dispense:  60 tablet    Refill:  4    Follow-up: Return if symptoms worsen or fail to improve.    Suzzane MARLA Blanch, MD

## 2024-04-03 NOTE — Assessment & Plan Note (Addendum)
 Recent hospitalization for DVT/PE Was initially started on Heparin drip, later transitioned to Eliquis Likely provoked episode due to recent immobilization - continue Eliquis for 6 months (started in 11/25) Check CBC and BMP

## 2024-04-03 NOTE — Assessment & Plan Note (Signed)
 Hospital chart reviewed, including discharge summary Medications reconciled and reviewed with the patient in detail

## 2024-04-03 NOTE — Patient Instructions (Signed)
 Please take Tramadol as needed for severe pain.  Please continue taking Eliquis regularly.

## 2024-04-04 ENCOUNTER — Ambulatory Visit: Payer: Self-pay | Admitting: Internal Medicine

## 2024-04-04 LAB — CBC WITH DIFFERENTIAL/PLATELET
Basophils Absolute: 0.1 x10E3/uL (ref 0.0–0.2)
Basos: 1 %
EOS (ABSOLUTE): 1.1 x10E3/uL — ABNORMAL HIGH (ref 0.0–0.4)
Eos: 15 %
Hematocrit: 39.5 % (ref 34.0–46.6)
Hemoglobin: 13.2 g/dL (ref 11.1–15.9)
Immature Grans (Abs): 0 x10E3/uL (ref 0.0–0.1)
Immature Granulocytes: 0 %
Lymphocytes Absolute: 1.9 x10E3/uL (ref 0.7–3.1)
Lymphs: 25 %
MCH: 31.4 pg (ref 26.6–33.0)
MCHC: 33.4 g/dL (ref 31.5–35.7)
MCV: 94 fL (ref 79–97)
Monocytes Absolute: 0.8 x10E3/uL (ref 0.1–0.9)
Monocytes: 11 %
Neutrophils Absolute: 3.7 x10E3/uL (ref 1.4–7.0)
Neutrophils: 48 %
Platelets: 318 x10E3/uL (ref 150–450)
RBC: 4.2 x10E6/uL (ref 3.77–5.28)
RDW: 12 % (ref 11.7–15.4)
WBC: 7.6 x10E3/uL (ref 3.4–10.8)

## 2024-04-04 LAB — BASIC METABOLIC PANEL WITH GFR
BUN/Creatinine Ratio: 13 (ref 12–28)
BUN: 12 mg/dL (ref 8–27)
CO2: 25 mmol/L (ref 20–29)
Calcium: 9.5 mg/dL (ref 8.7–10.3)
Chloride: 103 mmol/L (ref 96–106)
Creatinine, Ser: 0.9 mg/dL (ref 0.57–1.00)
Glucose: 95 mg/dL (ref 70–99)
Potassium: 4.8 mmol/L (ref 3.5–5.2)
Sodium: 142 mmol/L (ref 134–144)
eGFR: 63 mL/min/1.73 (ref >59)

## 2024-04-11 ENCOUNTER — Other Ambulatory Visit: Payer: Self-pay | Admitting: Licensed Clinical Social Worker

## 2024-04-11 ENCOUNTER — Encounter: Payer: Self-pay | Admitting: Licensed Clinical Social Worker

## 2024-04-11 NOTE — Patient Instructions (Signed)
 Visit Information  Thank you for taking time to visit with me today. Please don't hesitate to contact me if I can be of assistance to you in the future  Please call the Suicide and Crisis Lifeline: 988 call the [place]  National Suicide Prevention Lifeline: 254-101-9313 or TTY: 778-023-8530 TTY 320-366-0252) to talk to a trained counselor call 1-800-273-TALK (toll free, 24 hour hotline) go to [facility] [address] [place] [phone]) call 911 if you are experiencing a Mental Health or Behavioral Health Crisis or need someone to talk to.  Patient verbalized understanding of Care plan and visit instructions communicated this visit  Lyle Rung, BSW, MSW, LCSW Licensed Clinical Social Worker [employer]   [name] Direct Dial: [phone]

## 2024-04-11 NOTE — Patient Outreach (Signed)
 Complex Care Management   Visit Note  04/11/2024  Name:  Teresa Simon MRN: [id number] DOB: June 04, 1935  Situation: Referral received for Complex Care Management related to Mental/Behavioral Health diagnosis grief. I obtained verbal consent from Patient.  Visit completed with Patient  on the phone  Background:   Past Medical History:  Diagnosis Date   Aneurysm    Hypercholesteremia     Assessment: Patient Reported Symptoms:  Cognitive Cognitive Status: Insightful and able to interpret abstract concepts, Alert and oriented to person, place, and time, Able to follow simple commands Cognitive/Intellectual Conditions Management [RPT]: None reported or documented in medical history or problem list   Health Maintenance Behaviors: Annual physical exam Healing Pattern: Average Health Facilitated by: Rest, Stress management  Neurological Neurological Review of Symptoms: No symptoms reported Neurological Management Strategies: Routine screening, Adequate rest, Coping strategies Neurological Self-Management Outcome: 4 (good)  HEENT HEENT Symptoms Reported: No symptoms reported HEENT Management Strategies: Adequate rest, Routine screening HEENT Self-Management Outcome: 4 (good)    Cardiovascular Cardiovascular Symptoms Reported: No symptoms reported Does patient have uncontrolled Hypertension?: Yes Is patient checking Blood Pressure at home?: Yes Cardiovascular Management Strategies: Adequate rest, Routine screening Cardiovascular Self-Management Outcome: 4 (good)  Respiratory Respiratory Symptoms Reported: No symptoms reported Respiratory Management Strategies: Routine screening Respiratory Self-Management Outcome: 4 (good)  Endocrine Endocrine Symptoms Reported: No symptoms reported Is patient diabetic?: No Endocrine Self-Management Outcome: 4 (good)  Gastrointestinal Gastrointestinal Symptoms Reported: Reflux/heartburn, Unintentional weight gain Additional Gastrointestinal  Details: [place] she is happy about her weight loss but it may be contributed to grief. Patient reports grief has improved some over the last 10 days per patient. Gastrointestinal Management Strategies: Adequate rest Gastrointestinal Self-Management Outcome: 4 (good)    Genitourinary Genitourinary Symptoms Reported: No symptoms reported    Integumentary Integumentary Symptoms Reported: Not assessed    Musculoskeletal Musculoskelatal Symptoms Reviewed: Weakness Musculoskeletal Management Strategies: Adequate rest, Routine screening Musculoskeletal Self-Management Outcome: 4 (good) Musculoskeletal Comment: Patient went walking with her sisters last week and it went well Falls in the past year?: No Number of falls in past year: 1 or less Was there an injury with Fall?: No Fall Risk Category Calculator: 0 Patient Fall Risk Level: Low Fall Risk    Psychosocial Psychosocial Symptoms Reported: Report of significant loss, deaths, abandonment, traumatic incidents Additional Psychological Details: Patient lost her spouse and dog in the last 60 days. Behavioral Management Strategies: Adequate rest        04/11/2024    PHQ2-9 Depression Screening   Little interest or pleasure in doing things Not at all  Feeling down, depressed, or hopeless More than half the days  PHQ-2 - Total Score 2  Trouble falling or staying asleep, or sleeping too much Several days  Feeling tired or having little energy Several days  Poor appetite or overeating  Several days  Feeling bad about yourself - or that you are a failure or have let yourself or your family down Not at all  Trouble concentrating on things, such as reading the newspaper or watching television Not at all  Moving or speaking so slowly that other people could have noticed.  Or the opposite - being so fidgety or restless that you have been moving around a lot more than usual Not at all  Thoughts that you would be better off dead, or hurting yourself in  some way Not at all  PHQ2-9 Total Score 5  If you checked off any problems, how difficult have these problems made it for you  to do your work, take care of things at home, or get along with other people Somewhat difficult  Depression Interventions/Treatment Patient refuses Treatment (Bereavement resource education provided but patient declines at this time but was appreciative of the offer and support)    There were no vitals filed for this visit.    Medications Reviewed Today     Reviewed by Merlynn Lyle CROME, LCSW (Social Worker) on 04/11/24 at 1420  Med List Status: <None>   Medication Order Taking? Sig Documenting Provider Last Dose Status Informant  apixaban (ELIQUIS) 5 MG TABS tablet [id number]  Take 1 tablet (5 mg total) by mouth 2 (two) times daily. Tobie Suzzane POUR, MD  Active   APIXABAN WINN) VTE STARTER PACK (10MG  AND 5MG ) [id number]  Take as directed on package: start with two-5mg  tablets twice daily for 7 days. On day 8, switch to one-5mg  tablet twice daily. Sigdel, Santosh, MD  Active   Coenzyme Q10 100 MG capsule [id number]  Take 100 mg by mouth daily. [provider]  Active Self, Pharmacy Records, Child  Cyanocobalamin (VITAMIN B-12 SL) [id number]  Place 1 tablet under the tongue daily. [provider]  Active Self, Pharmacy Records, Child  famotidine (PEPCID) 20 MG tablet [id number]  Take 1 tablet (20 mg total) by mouth 2 (two) times daily. Tobie Suzzane POUR, MD  Active Self, Pharmacy Records, Child  ibuprofen (ADVIL) 200 MG tablet [id number]  Take 400 mg by mouth every 6 (six) hours as needed for mild pain (pain score 1-3). [provider]  Active Self, Pharmacy Records, Child  metoprolol succinate (TOPROL-XL) 25 MG 24 hr tablet [id number]  Take 1 tablet (25 mg total) by mouth daily. Sigdel, Santosh, MD  Active   Misc Natural Products (OSTEO BI-FLEX ADV JOINT SHIELD PO) [id number]  Take 2 capsules by mouth daily. [provider]  Active Self,  Pharmacy Records, Child  olmesartan  (BENICAR ) 5 MG tablet [id number]  Take 1 tablet (5 mg total) by mouth daily. Tobie Suzzane POUR, MD  Active Self, Pharmacy Records, Child  simvastatin  (ZOCOR ) 20 MG tablet [id number]  Take 1 tablet (20 mg total) by mouth at bedtime. Tobie Suzzane POUR, MD  Active Self, Pharmacy Records, Child  traMADol DANNY) 50 MG tablet [id number]  Take 1 tablet (50 mg total) by mouth every 8 (eight) hours as needed. Tobie Suzzane POUR, MD  Active   UNABLE TO FIND [id number]  Take 1 capsule by mouth 2 (two) times daily. Med Name: [facility] CAPSULE FOR MEMORY [provider]  Active   VITAMIN E PO [id number]  Take 1 tablet by mouth daily. [provider]  Active Self, Pharmacy Records, Child            Recommendation:   PCP Follow-up Specialty provider follow-up Consider grief therapy referral if symptoms get worse around the holidays Continue Current Plan of Care  Follow Up Plan:   Closing From:  Complex Care Management  Lyle Merlynn, BSW, MSW, LCSW Licensed Clinical Social Worker [employer]   [facility] [email or web address] Direct Dial: [phone]

## 2024-04-26 ENCOUNTER — Telehealth: Payer: Self-pay

## 2024-04-26 NOTE — Telephone Encounter (Signed)
 Copied from CRM [id number]. Topic: Clinical - Medication Question >> Apr 26, 2024  1:44 PM Berwyn MATSU wrote: Reason for CRM: Patient called in requesting to know if she will continue on this medication metoprolol  succinate (TOPROL -XL) 25 MG 24 hr tablet as it was given to her in the hospital on 03/26/24.    Patient is also requesting to know if she can go and travel even though she has blood clots.   May you please advise.

## 2024-04-27 ENCOUNTER — Other Ambulatory Visit: Payer: Self-pay | Admitting: Internal Medicine

## 2024-04-27 DIAGNOSIS — I4719 Other supraventricular tachycardia: Secondary | ICD-10-CM

## 2024-04-27 MED ORDER — METOPROLOL SUCCINATE ER 25 MG PO TB24: 25.0000 mg | ORAL_TABLET | Freq: Every day | ORAL | 1 refills | Status: AC

## 2024-04-27 NOTE — Telephone Encounter (Signed)
 Patient advised with verbal understanding

## 2024-05-04 ENCOUNTER — Telehealth: Payer: Self-pay

## 2024-05-04 NOTE — Addendum Note (Signed)
 Addended byBETHA TOBIE DOWNS on: 05/04/2024 05:20 PM   Modules accepted: Orders

## 2024-05-04 NOTE — Telephone Encounter (Signed)
 Copied from CRM #[id number]. Topic: Referral - Status >> May 04, 2024  9:50 AM Travis F wrote: Reason for CRM: Wanda with [facility] is calling in because they received a referral and they are not in network with patient's insurance.

## 2024-05-09 NOTE — Telephone Encounter (Signed)
 Referral has been redirected to Dr. [place] Office at this time.

## 2024-06-13 ENCOUNTER — Telehealth: Payer: Self-pay

## 2024-06-13 NOTE — Telephone Encounter (Signed)
 Patient advised

## 2024-06-13 NOTE — Telephone Encounter (Signed)
 Copied from CRM #[id number]. Topic: Clinical - Medication Question >> Jun 13, 2024  1:04 PM Teresa Simon wrote: Reason for CRM: Patient states she is currently on a blood thinner and she needs to have cataract surgery. She wants to know if she can go ahead and schedule to have the surgery being on the medication.  Patient can be reached at [phone]

## 2024-06-28 ENCOUNTER — Telehealth: Payer: Self-pay

## 2024-06-28 NOTE — Telephone Encounter (Signed)
 Copied from CRM #[id number]. Topic: Referral - Question >> Jun 28, 2024  3:53 PM Harlene ORN wrote: Reason for CRM: Patient is calling. Would like to follow up with PCP about getting a referral for a spot on her head about growth on the side of her head that is benign. Would like a referral to be sent in. Please call the patient back to discuss where and when she can have her referral done.

## 2024-07-05 ENCOUNTER — Ambulatory Visit: Payer: Medicare HMO

## 2024-07-18 ENCOUNTER — Ambulatory Visit: Admitting: Internal Medicine
